# Patient Record
Sex: Male | Born: 1935 | Race: Asian | Hispanic: No | Smoking: Former smoker
Health system: Southern US, Community
[De-identification: ages and names within clinical notes are randomized; demographics above are authoritative.]

## PROBLEM LIST (undated history)

## (undated) DIAGNOSIS — M109 Gout, unspecified: Secondary | ICD-10-CM

## (undated) DIAGNOSIS — I10 Essential (primary) hypertension: Secondary | ICD-10-CM

## (undated) DIAGNOSIS — E119 Type 2 diabetes mellitus without complications: Secondary | ICD-10-CM

## (undated) HISTORY — PX: HAND SURGERY: SHX662

---

## 2005-05-17 ENCOUNTER — Ambulatory Visit: Payer: Self-pay | Admitting: Family Medicine

## 2005-07-15 ENCOUNTER — Ambulatory Visit: Payer: Self-pay | Admitting: Family Medicine

## 2005-08-19 ENCOUNTER — Ambulatory Visit: Payer: Self-pay | Admitting: Family Medicine

## 2005-09-16 ENCOUNTER — Ambulatory Visit: Payer: Self-pay | Admitting: Family Medicine

## 2012-07-24 ENCOUNTER — Encounter (HOSPITAL_COMMUNITY): Payer: Self-pay

## 2012-07-24 ENCOUNTER — Emergency Department (INDEPENDENT_AMBULATORY_CARE_PROVIDER_SITE_OTHER)
Admission: EM | Admit: 2012-07-24 | Discharge: 2012-07-24 | Disposition: A | Discharge status: Home / Self Care | Payer: Medicare Other | Source: Home / Self Care

## 2012-07-24 DIAGNOSIS — M109 Gout, unspecified: Secondary | ICD-10-CM

## 2012-07-24 MED ORDER — TRAMADOL HCL 50 MG PO TABS
50.0000 mg | ORAL_TABLET | Freq: Four times a day (QID) | ORAL | Status: DC | PRN
Start: 2012-07-24 — End: 2015-09-24

## 2012-07-24 MED ORDER — COLCHICINE 0.6 MG PO TABS: 0.6000 mg | ORAL_TABLET | Freq: Two times a day (BID) | ORAL | Status: DC | PRN

## 2012-07-24 NOTE — ED Provider Notes (Signed)
History     CSN: [id number]  Arrival date & time 07/24/12  1101   First MD Initiated Contact with Patient 07/24/12 1134      Chief Complaint  Patient presents with  . Gout    (Consider location/radiation/quality/duration/timing/severity/associated sxs/prior treatment) HPI Patient is 79 year old male who presents with main concern of sudden onset ankle swelling, associated with redness and tenderness to palpation, pain sharp and constant, 5/10 in severity. Patient reports similar episodes in the past that were related to gout flare. Patient denies any specific alleviating factors but explains that he is unable to ambulate due to pain. He's not on any medicines at home for gout. Patient denies fevers and chills, no other systemic symptoms.  History reviewed. No pertinent past medical history.  Past Surgical History  Procedure Laterality Date  . Hand surgery      No family history on file.  History  Substance Use Topics  . Smoking status: Never Smoker   . Smokeless tobacco: Not on file  . Alcohol Use: Yes      Review of Systems  Constitutional: Negative for fever, chills, diaphoresis, activity change, appetite change and fatigue.  HENT: Negative for ear pain, nosebleeds, congestion, facial swelling, rhinorrhea, neck pain, neck stiffness and ear discharge.   Eyes: Negative for pain, discharge, redness, itching and visual disturbance.  Respiratory: Negative for cough, choking, chest tightness, shortness of breath, wheezing and stridor.   Cardiovascular: Negative for chest pain, palpitations and leg swelling.  Gastrointestinal: Negative for abdominal distention.  Genitourinary: Negative for dysuria, urgency, frequency, hematuria, flank pain, decreased urine volume, difficulty urinating and dyspareunia.  Musculoskeletal: Negative for back pain Neurological: Negative for dizziness, tremors, seizures, syncope, facial asymmetry, speech difficulty, weakness, light-headedness,  numbness and headaches.  Hematological: Negative for adenopathy. Does not bruise/bleed easily.  Psychiatric/Behavioral: Negative for hallucinations, behavioral problems, confusion, dysphoric mood, decreased concentration and agitation.    Allergies  Review of patient's allergies indicates no known allergies.  Home Medications   Current Outpatient Rx  Name  Route  Sig  Dispense  Refill  . colchicine 0.6 MG tablet   Oral   Take 1 tablet (0.6 mg total) by mouth 2 (two) times daily as needed.   45 tablet   3   . traMADol (ULTRAM) 50 MG tablet   Oral   Take 1 tablet (50 mg total) by mouth every 6 (six) hours as needed for pain.   65 tablet   1     BP 173/42  Pulse 91  Temp(Src) 97.9 F (36.6 C) (Oral)  SpO2 100%  Physical Exam Constitutional: Appears well-developed and well-nourished. No distress.  HENT: Normocephalic. External right and left ear normal. Oropharynx is clear and moist.  Eyes: Conjunctivae and EOM are normal. PERRLA, no scleral icterus.  Neck: Normal ROM. Neck supple. No JVD. No tracheal deviation. No thyromegaly.  CVS: RRR, S1/S2 +, no murmurs, no gallops, no carotid bruit.  Pulmonary: Effort and breath sounds normal, no stridor, rhonchi, wheezes, rales.  Abdominal: Soft. BS +,  no distension, tenderness, rebound or guarding.  Musculoskeletal: Normal range of motion.swelling and tenderness to palpation noted around the ankle area on the left side. Findings consistent with gout flare  Lymphadenopathy: No lymphadenopathy noted, cervical, inguinal. Neuro: Alert. Normal reflexes, muscle tone coordination. No cranial nerve deficit. Skin: Skin is warm and dry. No rash noted. Not diaphoretic. No erythema. No pallor.  Psychiatric: Normal mood and affect. Behavior, judgment, thought content normal.    ED Course  Procedures (  including critical care time)  Labs Reviewed - No data to display No results found.   1. Gout   - physical exam findings and symptoms  consistent with gout flare - Will prescribe a course of cold seen along with tramadol, patient is not on allopurinol at home and will not prescribe it at this time - I advised patient to come back to see [place] in 2 weeks to evaluate healing and resolution of gout - Followup appointment will plan on checking uric acid level and initiating allopurinol if gout flare is essentially resolved    MDM  Gout        Dorothea Ogle, MD 07/24/12 1201

## 2012-07-24 NOTE — ED Notes (Signed)
Patient has history of gout Had a flair up that started yesterday in his left ankle

## 2015-09-24 ENCOUNTER — Encounter (HOSPITAL_COMMUNITY): Payer: Self-pay | Admitting: Emergency Medicine

## 2015-09-24 ENCOUNTER — Emergency Department (HOSPITAL_COMMUNITY)
Admission: EM | Admit: 2015-09-24 | Discharge: 2015-09-24 | Disposition: A | Discharge status: Home / Self Care | Payer: Medicare Other | Attending: Emergency Medicine | Admitting: Emergency Medicine

## 2015-09-24 DIAGNOSIS — I10 Essential (primary) hypertension: Secondary | ICD-10-CM | POA: Diagnosis not present

## 2015-09-24 DIAGNOSIS — E162 Hypoglycemia, unspecified: Secondary | ICD-10-CM

## 2015-09-24 DIAGNOSIS — Z791 Long term (current) use of non-steroidal anti-inflammatories (NSAID): Secondary | ICD-10-CM | POA: Insufficient documentation

## 2015-09-24 DIAGNOSIS — M1009 Idiopathic gout, multiple sites: Secondary | ICD-10-CM | POA: Diagnosis not present

## 2015-09-24 DIAGNOSIS — M109 Gout, unspecified: Secondary | ICD-10-CM

## 2015-09-24 DIAGNOSIS — E11649 Type 2 diabetes mellitus with hypoglycemia without coma: Secondary | ICD-10-CM | POA: Diagnosis not present

## 2015-09-24 HISTORY — DX: Essential (primary) hypertension: I10

## 2015-09-24 HISTORY — DX: Gout, unspecified: M10.9

## 2015-09-24 HISTORY — DX: Type 2 diabetes mellitus without complications: E11.9

## 2015-09-24 LAB — CBG MONITORING, ED
Glucose-Capillary: 166 mg/dL — ABNORMAL HIGH (ref 65–99)
Glucose-Capillary: 283 mg/dL — ABNORMAL HIGH (ref 65–99)

## 2015-09-24 LAB — CBC WITH DIFFERENTIAL/PLATELET
BASOS ABS: 0 10*3/uL (ref 0.0–0.1)
Basophils Relative: 0 %
EOS PCT: 0 %
Eosinophils Absolute: 0 10*3/uL (ref 0.0–0.7)
HEMATOCRIT: 48.9 % (ref 39.0–52.0)
HEMOGLOBIN: 15.1 g/dL (ref 13.0–17.0)
LYMPHS PCT: 15 %
Lymphs Abs: 4.3 10*3/uL — ABNORMAL HIGH (ref 0.7–4.0)
MCH: 28 pg (ref 26.0–34.0)
MCHC: 30.9 g/dL (ref 30.0–36.0)
MCV: 90.6 fL (ref 78.0–100.0)
MONOS PCT: 9 %
Monocytes Absolute: 2.6 10*3/uL — ABNORMAL HIGH (ref 0.1–1.0)
NEUTROS ABS: 22 10*3/uL — AB (ref 1.7–7.7)
Neutrophils Relative %: 76 %
Platelets: 263 10*3/uL (ref 150–400)
RBC: 5.4 MIL/uL (ref 4.22–5.81)
RDW: 15.1 % (ref 11.5–15.5)
WBC Morphology: INCREASED
WBC: 28.9 10*3/uL — ABNORMAL HIGH (ref 4.0–10.5)

## 2015-09-24 LAB — BASIC METABOLIC PANEL
ANION GAP: 31 — AB (ref 5–15)
Anion gap: 13 (ref 5–15)
BUN: 26 mg/dL — AB (ref 6–20)
BUN: 24 mg/dL — AB (ref 6–20)
CALCIUM: 7.4 mg/dL — AB (ref 8.9–10.3)
CHLORIDE: 116 mmol/L — AB (ref 101–111)
CHLORIDE: 113 mmol/L — AB (ref 101–111)
CO2: 7 mmol/L — AB (ref 22–32)
CO2: 15 mmol/L — ABNORMAL LOW (ref 22–32)
CREATININE: 1.28 mg/dL — AB (ref 0.61–1.24)
Calcium: 8.7 mg/dL — ABNORMAL LOW (ref 8.9–10.3)
Creatinine, Ser: 1.43 mg/dL — ABNORMAL HIGH (ref 0.61–1.24)
GFR calc Af Amer: 50 mL/min — ABNORMAL LOW (ref >60)
GFR, EST AFRICAN AMERICAN: 58 mL/min — AB (ref >60)
GFR, EST NON AFRICAN AMERICAN: 43 mL/min — AB (ref >60)
GFR, EST NON AFRICAN AMERICAN: 50 mL/min — AB (ref >60)
Glucose, Bld: 282 mg/dL — ABNORMAL HIGH (ref 65–99)
POTASSIUM: 4.2 mmol/L (ref 3.5–5.1)
Potassium: 5.1 mmol/L (ref 3.5–5.1)
SODIUM: 141 mmol/L (ref 135–145)
Sodium: 154 mmol/L — ABNORMAL HIGH (ref 135–145)

## 2015-09-24 LAB — LIPASE, BLOOD: Lipase: 19 U/L (ref 11–51)

## 2015-09-24 LAB — HEPATIC FUNCTION PANEL
ALBUMIN: 4.9 g/dL (ref 3.5–5.0)
ALK PHOS: 62 U/L (ref 38–126)
ALT: 63 U/L (ref 17–63)
AST: 219 U/L — AB (ref 15–41)
BILIRUBIN DIRECT: 0.2 mg/dL (ref 0.1–0.5)
BILIRUBIN TOTAL: 0.8 mg/dL (ref 0.3–1.2)
Indirect Bilirubin: 0.6 mg/dL (ref 0.3–0.9)
Total Protein: 8.5 g/dL — ABNORMAL HIGH (ref 6.5–8.1)

## 2015-09-24 LAB — ETHANOL: ALCOHOL ETHYL (B): 64 mg/dL — AB (ref <5)

## 2015-09-24 MED ORDER — INDOMETHACIN 25 MG PO CAPS: 25.0000 mg | ORAL_CAPSULE | Freq: Three times a day (TID) | ORAL | Status: DC | PRN

## 2015-09-24 MED ORDER — DEXTROSE 50 % IV SOLN
INTRAVENOUS | Status: AC
  Administered 2015-09-24: 12:00:00
  Filled 2015-09-24: qty 50

## 2015-09-24 MED ORDER — SODIUM CHLORIDE 0.9 % IV SOLN
INTRAVENOUS | Status: DC
  Administered 2015-09-24: 15:00:00 via INTRAVENOUS

## 2015-09-24 MED ORDER — SODIUM CHLORIDE 0.9 % IV BOLUS (SEPSIS)
1000.0000 mL | Freq: Once | INTRAVENOUS | Status: AC
  Administered 2015-09-24: 1000 mL via INTRAVENOUS

## 2015-09-24 NOTE — Discharge Instructions (Signed)
Avoid all forms of alcohol. Drink 2 L of water each day in addition to your usual fluid intake. Try to eat 3 meals, balanced, each day. Call your Dr. for a follow-up appointment in one week. Have your doctor recheck your blood tests to evaluate the kidney function when you see them.    Hypoglycemia Low blood sugar (hypoglycemia) means that the level of sugar in your blood is lower than it should be. Signs of low blood sugar include:  Getting sweaty.  Feeling hungry.  Feeling dizzy or weak.  Feeling sleepier than normal.  Feeling nervous.  Headaches.  Having a fast heartbeat. Low blood sugar can happen fast and can be an emergency. Your doctor can do tests to check your blood sugar level. You can have low blood sugar and not have diabetes. HOME CARE  Check your blood sugar as told by your doctor. If it is less than 70 mg/dl or as told by your doctor, take 1 of the following:  3 to 4 glucose tablets.   cup clear juice.   cup soda pop, not diet.  1 cup milk.  5 to 6 hard candies.  Recheck blood sugar after 15 minutes. Repeat until it is at the right level.  Eat a snack if it is more than 1 hour until the next meal.  Only take medicine as told by your doctor.  Do not skip meals. Eat on time.  Do not drink alcohol except with meals.  Check your blood glucose before driving.  Check your blood glucose before and after exercise.  Always carry treatment with you, such as glucose pills.  Always wear a medical alert bracelet if you have diabetes. GET HELP RIGHT AWAY IF:   Your blood glucose goes below 70 mg/dl or as told by your doctor, and you:  Are confused.  Are not able to swallow.  Pass out (faint).  You cannot treat yourself. You may need someone to help you.  You have low blood sugar problems often.  You have problems from your medicines.  You are not feeling better after 3 to 4 days.  You have vision changes. MAKE SURE YOU:   Understand  these instructions.  Will watch this condition.  Will get help right away if you are not doing well or get worse.   This information is not intended to replace advice given to you by your health care provider. Make sure you discuss any questions you have with your health care provider.   Document Released: 07/24/2009 Document Revised: 05/20/2014 Document Reviewed: 01/03/2015 Elsevier Interactive Patient Education 2016 Elsevier Inc.  Gout Gout is when your joints become red, sore, and swell (inflamed). This is caused by the buildup of uric acid crystals in the joints. Uric acid is a chemical that is normally in the blood. If the level of uric acid gets too high in the blood, these crystals form in your joints and tissues. Over time, these crystals can form into masses near the joints and tissues. These masses can destroy bone and cause the bone to look misshapen (deformed). HOME CARE   Do not take aspirin for pain.  Only take medicine as told by your doctor.  Rest the joint as much as you can. When in bed, keep sheets and blankets off painful areas.  Keep the sore joints raised (elevated).  Put warm or cold packs on painful joints. Use of warm or cold packs depends on which works best for you.  Use crutches if  the painful joint is in your leg.  Drink enough fluids to keep your pee (urine) clear or pale yellow. Limit alcohol, sugary drinks, and drinks with fructose in them.  Follow your diet instructions. Pay careful attention to how much protein you eat. Include fruits, vegetables, whole grains, and fat-free or low-fat milk products in your daily diet. Talk to your doctor or dietitian about the use of coffee, vitamin C, and cherries. These may help lower uric acid levels.  Keep a healthy body weight. GET HELP RIGHT AWAY IF:   You have watery poop (diarrhea), throw up (vomit), or have any side effects from medicines.  You do not feel better in 24 hours, or you are getting  worse.  Your joint becomes suddenly more tender, and you have chills or a fever. MAKE SURE YOU:   Understand these instructions.  Will watch your condition.  Will get help right away if you are not doing well or get worse.   This information is not intended to replace advice given to you by your health care provider. Make sure you discuss any questions you have with your health care provider.   Document Released: 02/06/2008 Document Revised: 05/20/2014 Document Reviewed: 12/11/2011 Elsevier Interactive Patient Education [employer] Elsevier Inc.

## 2015-09-24 NOTE — ED Notes (Signed)
Pt from home via EMS after family found unresponsive. Pt had CBG 19-was given 1 mg glucagon IM which increased CBG to 44. Pt received another 1 mg glucagon IM after CBG was 32. Pt does not speak english. Family member at bedside.  Pt given 1 amp D50 upon arrival and is now awake and talking. Pt in NAD

## 2015-09-24 NOTE — ED Provider Notes (Signed)
CSN: [id number]     Arrival date & time 09/24/15  1128 History   First MD Initiated Contact with Patient 09/24/15 1204     Chief Complaint  Patient presents with  . Hypoglycemia     (Consider location/radiation/quality/duration/timing/severity/associated sxs/prior Treatment) HPI   Brian Potter is a 82 y.o. male who presents for evaluation of unresponsiveness. EMS transported him. They checked his CBG, that was low at 19. They tented IV access but were unable. They treated him with IM glucagon 2 mg. His blood sugar improved to 32 and he arrived to the ED, unresponsive, restrained.  Level V caveat- altered mental status    Past Medical History  Diagnosis Date  . Diabetes mellitus without complication (HCC)   . Gout   . Hypertension    Past Surgical History  Procedure Laterality Date  . Hand surgery     No family history on file. Social History  Substance Use Topics  . Smoking status: Never Smoker   . Smokeless tobacco: None  . Alcohol Use: Yes    Review of Systems  Unable to perform ROS: Mental status change      Allergies  Review of patient's allergies indicates no known allergies.  Home Medications   Prior to Admission medications   Medication Sig Start Date End Date Taking? Authorizing Provider  indomethacin (INDOCIN) 50 MG capsule Take 50 mg by mouth 3 (three) times daily as needed (inflammation or gout.).   Yes Historical Provider, MD   BP 167/65 mmHg  Pulse 98  Temp(Src) 98.1 F (36.7 C) (Oral)  Resp 20  SpO2 100% Physical Exam  Constitutional: He appears well-developed and well-nourished. He appears distressed.  HENT:  Head: Normocephalic and atraumatic.  Right Ear: External ear normal.  Left Ear: External ear normal.  Eyes: Conjunctivae and EOM are normal. Pupils are equal, round, and reactive to light.  Neck: Normal range of motion and phonation normal. Neck supple.  Cardiovascular: Normal rate, regular rhythm and normal heart sounds.    Pulmonary/Chest: Effort normal and breath sounds normal. No respiratory distress. He exhibits no bony tenderness.  Abdominal: Soft. There is no tenderness.  Musculoskeletal: Normal range of motion.  Neurological: No cranial nerve deficit or sensory deficit. He exhibits normal muscle tone. Coordination normal.  Uncooperative, moving all extremities, in a nonpurposeful manner. Does not respond to verbal stimulation.  Skin: Skin is warm, dry and intact.  Psychiatric:  Agitated  Nursing note and vitals reviewed.   ED Course  Procedures (including critical care time)  Immediate care on arrival-  I placed a left external jugular line, using an 18-gauge angiocath. He was given D50 with immediate recovery of normal mental status. Evaluation ordered.  Medications  0.9 %  sodium chloride infusion ( Intravenous New Bag/Given 09/24/15 1510)  dextrose 50 % solution (  Given 09/24/15 1130)  sodium chloride 0.9 % bolus 1,000 mL (0 mLs Intravenous Stopped 09/24/15 1458)    Patient Vitals for the past 24 hrs:  BP Temp Temp src Pulse Resp SpO2  09/24/15 1627 167/65 mmHg - - 98 20 100 %  09/24/15 1512 166/76 mmHg 98.1 F (36.7 C) Oral 97 22 100 %  09/24/15 1140 139/65 mmHg 97.4 F (36.3 C) Oral 115 24 94 %   14:10- he remains alert, calm, cooperative. He is eating and drinking. His daughter is here and states that the patient was drinking yesterday because he was distraught over a family member who attempted suicide. He does not have serious chronic  illnesses including diabetes, cardiac disease or hypertension. He does have gout, and takes indomethacin, but has been out of it for 1 week. He gets his care at a local primary care clinic. He lives with family members who are assisting in his health care.  5:36 PM Reevaluation with update and discussion. After initial assessment and treatment, an updated evaluation reveals he continues to be Aleve and drink, and is comfortable. Family members report that he  has completely normal mental status at this time. Vital signs are reviewed, and are reassuring. The patient and his family members are comfortable with him going home. All questions were answered. Brian Potter L    Labs Review Labs Reviewed  HEPATIC FUNCTION PANEL - Abnormal; Notable for the following:    Total Protein 8.5 (*)    AST 219 (*)    All other components within normal limits  ETHANOL - Abnormal; Notable for the following:    Alcohol, Ethyl (B) 64 (*)    All other components within normal limits  BASIC METABOLIC PANEL - Abnormal; Notable for the following:    Sodium 154 (*)    Chloride 116 (*)    CO2 7 (*)    Glucose, Bld <20 (*)    BUN 26 (*)    Creatinine, Ser 1.43 (*)    Calcium 8.7 (*)    GFR calc non Af Amer 43 (*)    GFR calc Af Amer 50 (*)    Anion gap 31 (*)    All other components within normal limits  CBC WITH DIFFERENTIAL/PLATELET - Abnormal; Notable for the following:    WBC 28.9 (*)    Neutro Abs 22.0 (*)    Lymphs Abs 4.3 (*)    Monocytes Absolute 2.6 (*)    All other components within normal limits  BASIC METABOLIC PANEL - Abnormal; Notable for the following:    Chloride 113 (*)    CO2 15 (*)    Glucose, Bld 282 (*)    BUN 24 (*)    Creatinine, Ser 1.28 (*)    Calcium 7.4 (*)    GFR calc non Af Amer 50 (*)    GFR calc Af Amer 58 (*)    All other components within normal limits  CBG MONITORING, ED - Abnormal; Notable for the following:    Glucose-Capillary 166 (*)    All other components within normal limits  CBG MONITORING, ED - Abnormal; Notable for the following:    Glucose-Capillary 283 (*)    All other components within normal limits  LIPASE, BLOOD    Imaging Review No results found. I have personally reviewed and evaluated these images and lab results as part of my medical decision-making.   EKG Interpretation None      MDM   Final diagnoses:  Hypoglycemia  Gout of multiple sites, unspecified cause, unspecified chronicity     Hypoglycemia, likely nutritional and associated with heavy use of alcohol. His events with an elevated alcohol level, greater than 12 hours after his last drink. Progressively improved with a single injection of D50, and maintained elevated, after eating, drinking. Incidental electrolyte abnormalities are likely related to profound hypoglycemia. Is improved with eating, IV fluids, and drinking oral fluids. Doubt metabolic instability or impending vascular collapse. There is no indication at this time for admission. I suspect mild dehydration, which is likely nutritional. His degree of improvement justifies discharge with close follow-up as an outpatient.  Nursing Notes Reviewed/ Care Coordinated Applicable Imaging Reviewed Interpretation of Laboratory  Data incorporated into ED treatment  The patient appears reasonably screened and/or stabilized for discharge and I doubt any other medical condition or other [facility] requiring further screening, evaluation, or treatment in the ED at this time prior to discharge.  Plan: Home Medications- Indocin; Home Treatments- avoid alcohol, drink extra fluid, follow-up with PCP; return here if the recommended treatment, does not improve the symptoms; Recommended follow up- PCP 1 week for checkup and repeat blood testing   Mancel Bale, MD 09/24/15 [phone]

## 2015-09-24 NOTE — ED Notes (Signed)
Bed: RESB Expected date:  Expected time:  Means of arrival:  Comments: Hypoglycemia CBG 19 not IV access

## 2015-09-24 NOTE — ED Notes (Signed)
Tolerating PO fluids/food.

## 2016-06-04 DIAGNOSIS — M79671 Pain in right foot: Secondary | ICD-10-CM | POA: Diagnosis not present

## 2017-09-20 DIAGNOSIS — Z23 Encounter for immunization: Secondary | ICD-10-CM | POA: Diagnosis not present

## 2018-01-25 DIAGNOSIS — Z23 Encounter for immunization: Secondary | ICD-10-CM | POA: Diagnosis not present

## 2018-03-20 DIAGNOSIS — M79671 Pain in right foot: Secondary | ICD-10-CM | POA: Diagnosis not present

## 2018-03-20 DIAGNOSIS — M25572 Pain in left ankle and joints of left foot: Secondary | ICD-10-CM | POA: Diagnosis not present

## 2018-03-20 DIAGNOSIS — M25562 Pain in left knee: Secondary | ICD-10-CM | POA: Diagnosis not present

## 2019-04-22 ENCOUNTER — Emergency Department (HOSPITAL_COMMUNITY)
Admission: EM | Admit: 2019-04-22 | Discharge: 2019-04-22 | Disposition: A | Discharge status: Home / Self Care | Payer: Medicare Other | Attending: Emergency Medicine | Admitting: Emergency Medicine

## 2019-04-22 ENCOUNTER — Emergency Department (HOSPITAL_COMMUNITY): Payer: Medicare Other

## 2019-04-22 ENCOUNTER — Other Ambulatory Visit: Payer: Self-pay

## 2019-04-22 DIAGNOSIS — R2231 Localized swelling, mass and lump, right upper limb: Secondary | ICD-10-CM | POA: Insufficient documentation

## 2019-04-22 DIAGNOSIS — I10 Essential (primary) hypertension: Secondary | ICD-10-CM | POA: Insufficient documentation

## 2019-04-22 DIAGNOSIS — E119 Type 2 diabetes mellitus without complications: Secondary | ICD-10-CM | POA: Insufficient documentation

## 2019-04-22 DIAGNOSIS — M1A9XX0 Chronic gout, unspecified, without tophus (tophi): Secondary | ICD-10-CM

## 2019-04-22 DIAGNOSIS — M79641 Pain in right hand: Secondary | ICD-10-CM | POA: Diagnosis present

## 2019-04-22 LAB — CBC WITH DIFFERENTIAL/PLATELET
Abs Immature Granulocytes: 0.04 10*3/uL (ref 0.00–0.07)
Basophils Absolute: 0 10*3/uL (ref 0.0–0.1)
Basophils Relative: 0 %
Eosinophils Absolute: 0.5 10*3/uL (ref 0.0–0.5)
Eosinophils Relative: 6 %
HCT: 38.7 % — ABNORMAL LOW (ref 39.0–52.0)
Hemoglobin: 11.8 g/dL — ABNORMAL LOW (ref 13.0–17.0)
Immature Granulocytes: 0 %
Lymphocytes Relative: 35 %
Lymphs Abs: 3.3 10*3/uL (ref 0.7–4.0)
MCH: 25.5 pg — ABNORMAL LOW (ref 26.0–34.0)
MCHC: 30.5 g/dL (ref 30.0–36.0)
MCV: 83.6 fL (ref 80.0–100.0)
Monocytes Absolute: 0.9 10*3/uL (ref 0.1–1.0)
Monocytes Relative: 10 %
Neutro Abs: 4.7 10*3/uL (ref 1.7–7.7)
Neutrophils Relative %: 49 %
Platelets: 344 10*3/uL (ref 150–400)
RBC: 4.63 MIL/uL (ref 4.22–5.81)
RDW: 17.1 % — ABNORMAL HIGH (ref 11.5–15.5)
WBC: 9.5 10*3/uL (ref 4.0–10.5)
nRBC: 0 % (ref 0.0–0.2)

## 2019-04-22 LAB — COMPREHENSIVE METABOLIC PANEL
ALT: 7 U/L (ref 0–44)
AST: 16 U/L (ref 15–41)
Albumin: 3 g/dL — ABNORMAL LOW (ref 3.5–5.0)
Alkaline Phosphatase: 38 U/L (ref 38–126)
Anion gap: 10 (ref 5–15)
BUN: 14 mg/dL (ref 8–23)
CO2: 25 mmol/L (ref 22–32)
Calcium: 8.7 mg/dL — ABNORMAL LOW (ref 8.9–10.3)
Chloride: 106 mmol/L (ref 98–111)
Creatinine, Ser: 1.2 mg/dL (ref 0.61–1.24)
GFR calc Af Amer: >60 mL/min (ref >60)
GFR calc non Af Amer: 54 mL/min — ABNORMAL LOW (ref >60)
Glucose, Bld: 102 mg/dL — ABNORMAL HIGH (ref 70–99)
Potassium: 4.3 mmol/L (ref 3.5–5.1)
Sodium: 141 mmol/L (ref 135–145)
Total Bilirubin: 0.5 mg/dL (ref 0.3–1.2)
Total Protein: 6.9 g/dL (ref 6.5–8.1)

## 2019-04-22 MED ORDER — INDOMETHACIN 50 MG PO CAPS: 50.0000 mg | ORAL_CAPSULE | Freq: Three times a day (TID) | ORAL | 0 refills | Status: DC | PRN

## 2019-04-22 MED ORDER — INDOMETHACIN 25 MG PO CAPS
50.0000 mg | ORAL_CAPSULE | Freq: Once | ORAL | Status: AC
  Administered 2019-04-22: 11:00:00 50 mg via ORAL
  Filled 2019-04-22: qty 2

## 2019-04-22 NOTE — ED Provider Notes (Signed)
[facility] EMERGENCY DEPARTMENT Provider Note   CSN: [id number] Arrival date & time: 04/22/19  0845     History Chief Complaint  Patient presents with  . Wound Infection    Brian Potter is a 85 y.o. male.  Presents to the emergency room with chief complaint of hand pain and swelling.  Patient states he has had a very long history of gout, has had long history of having swelling in his right hand associated pain.  States he recently ran out of his indomethacin and has only been taking Tylenol.  States Tylenol is not as effective and wants a refill for his indomethacin.  Denies any new skin color changes, no significant change in swelling, noted that swelling today is actually better than it has been over the past couple weeks.  No fevers.  Has been evaluated by [facility] orthopedic previously, PA Dannielle Burn prescribed last course of indomethacin.  Not on any other medicines for gout.  States he had a prior history of having gout flare in his big toe, not currently having any pain or swelling there.  HPI     Past Medical History:  Diagnosis Date  . Diabetes mellitus without complication (HCC)   . Gout   . Hypertension     There are no problems to display for this patient.   Past Surgical History:  Procedure Laterality Date  . HAND SURGERY         No family history on file.  Social History   Tobacco Use  . Smoking status: Never Smoker  Substance Use Topics  . Alcohol use: Yes  . Drug use: Not on file    Home Medications Prior to Admission medications   Medication Sig Start Date End Date Taking? Authorizing Provider  indomethacin (INDOCIN) 25 MG capsule Take 1 capsule (25 mg total) by mouth 3 (three) times daily as needed for moderate pain. 09/24/15   Mancel Bale, MD    Allergies    Patient has no known allergies.  Review of Systems   Review of Systems  Constitutional: Negative for chills and fever.  HENT: Negative for ear pain and sore  throat.   Eyes: Negative for pain and visual disturbance.  Respiratory: Negative for cough and shortness of breath.   Cardiovascular: Negative for chest pain and palpitations.  Gastrointestinal: Negative for abdominal pain and vomiting.  Genitourinary: Negative for dysuria and hematuria.  Musculoskeletal: Positive for arthralgias. Negative for back pain.  Skin: Negative for color change and rash.  Neurological: Negative for seizures and syncope.  All other systems reviewed and are negative.   Physical Exam Updated Vital Signs BP (!) 163/74 (BP Location: Left Arm)   Pulse 80   Temp 98.1 F (36.7 C) (Oral)   Resp 16   SpO2 100%   Physical Exam Vitals and nursing note reviewed.  Constitutional:      Appearance: He is well-developed.  HENT:     Head: Normocephalic and atraumatic.  Eyes:     Conjunctiva/sclera: Conjunctivae normal.  Cardiovascular:     Rate and Rhythm: Normal rate and regular rhythm.     Heart sounds: No murmur.  Pulmonary:     Effort: Pulmonary effort is normal. No respiratory distress.     Breath sounds: Normal breath sounds.  Abdominal:     Palpations: Abdomen is soft.     Tenderness: There is no abdominal tenderness.  Musculoskeletal:     Cervical back: Neck supple.     Comments: RUE:  dorsum of right hand has generalized swelling, centered over 2nd MCP, no overlying erythema, no warmth to touch, normal wrist and hand ROM, distal sensation and cap refill intact  Skin:    General: Skin is warm and dry.  Neurological:     Mental Status: He is alert.     ED Results / Procedures / Treatments   Labs (all labs ordered are listed, but only abnormal results are displayed) Labs Reviewed  COMPREHENSIVE METABOLIC PANEL - Abnormal; Notable for the following components:      Result Value   Glucose, Bld 102 (*)    Calcium 8.7 (*)    Albumin 3.0 (*)    GFR calc non Af Amer 54 (*)    All other components within normal limits  CBC WITH DIFFERENTIAL/PLATELET -  Abnormal; Notable for the following components:   Hemoglobin 11.8 (*)    HCT 38.7 (*)    MCH 25.5 (*)    RDW 17.1 (*)    All other components within normal limits    EKG None  Radiology No results found.  Procedures Procedures (including critical care time)  Medications Ordered in ED Medications  indomethacin (INDOCIN) capsule 50 mg (has no administration in time range)    ED Course  I have reviewed the triage vital signs and the nursing notes.  Pertinent labs & imaging results that were available during my care of the patient were reviewed by me and considered in my medical decision making (see chart for details).    MDM Rules/Calculators/A&P     CHA2DS2/VAS Stroke Risk Points      N/A >= 2 Points: High Risk  1 - 1.99 Points: Medium Risk  0 Points: Low Risk    A final score could not be computed because of missing components.: Last  Change: N/A     This score determines the patient's risk of having a stroke if the  patient has atrial fibrillation.      This score is not applicable to this patient. Components are not  calculated.                   85 year old male presents to ER with right hand swelling, pain.  Patient has long history of gout, stated his primary purpose in coming to ER today with medication refill.  He denied any acute changes in the pain and swelling in his hand.  Has had symptoms for many months.  Suspect patient suffers from chronic gout.  Low suspicion for septic joint process at this time.  Labs within normal limits, x-rays unremarkable.  He has seen orthopedic previously.  I recommended patient follow-up in our clinic.  Provide a refill of his indomethacin which he states has significantly helped previously.  Patient is [place] only speaking, utilized Optometrist for entirety of visit.  Final Clinical Impression(s) / ED Diagnoses Final diagnoses:  Chronic gout without tophus, unspecified cause, unspecified site    Rx / DC Orders ED  Discharge Orders    None       Lucrezia Starch, MD 04/22/19 1534

## 2019-04-22 NOTE — ED Notes (Signed)
Patient transported to X-ray 

## 2019-04-22 NOTE — ED Notes (Signed)
Patient verbalizes understanding of discharge instructions. Opportunity for questioning and answers were provided. Armband removed by staff, pt discharged from ED.  

## 2019-04-22 NOTE — ED Triage Notes (Signed)
Pt from home with daughter complaining of swelling to right hand, hx of gout. Pt speaks vietnamese and interpretation provided by pt's daughter. Pt has been out of his indamethacin x 1 month. Axox4.

## 2019-04-22 NOTE — Discharge Instructions (Signed)
Please call be orthopedic office as discussed to schedule a follow-up appointment, ideally to be seen within the next week regarding your hand pain and gout.  Please take medicine as prescribed as needed for pain control.  Return to ER if you develop worsening swelling, fever or other new concerning symptom.

## 2019-05-27 DIAGNOSIS — M79671 Pain in right foot: Secondary | ICD-10-CM | POA: Diagnosis not present

## 2019-05-27 DIAGNOSIS — M79641 Pain in right hand: Secondary | ICD-10-CM | POA: Diagnosis not present

## 2020-06-27 DIAGNOSIS — M25521 Pain in right elbow: Secondary | ICD-10-CM | POA: Diagnosis not present

## 2020-07-04 ENCOUNTER — Emergency Department (HOSPITAL_COMMUNITY): Payer: Medicare Other

## 2020-07-04 ENCOUNTER — Inpatient Hospital Stay (HOSPITAL_COMMUNITY): Payer: Medicare Other

## 2020-07-04 ENCOUNTER — Encounter (HOSPITAL_COMMUNITY): Payer: Self-pay | Admitting: Emergency Medicine

## 2020-07-04 ENCOUNTER — Inpatient Hospital Stay (HOSPITAL_COMMUNITY)
Admission: EM | Admit: 2020-07-04 | Discharge: 2020-07-07 | DRG: 554 | Disposition: A | Discharge status: Home Health | Payer: Medicare Other | Attending: Internal Medicine | Admitting: Internal Medicine

## 2020-07-04 DIAGNOSIS — B999 Unspecified infectious disease: Secondary | ICD-10-CM

## 2020-07-04 DIAGNOSIS — M7989 Other specified soft tissue disorders: Secondary | ICD-10-CM | POA: Diagnosis not present

## 2020-07-04 DIAGNOSIS — M869 Osteomyelitis, unspecified: Secondary | ICD-10-CM

## 2020-07-04 DIAGNOSIS — R609 Edema, unspecified: Secondary | ICD-10-CM | POA: Diagnosis not present

## 2020-07-04 DIAGNOSIS — Z79899 Other long term (current) drug therapy: Secondary | ICD-10-CM | POA: Diagnosis not present

## 2020-07-04 DIAGNOSIS — L03119 Cellulitis of unspecified part of limb: Secondary | ICD-10-CM | POA: Diagnosis not present

## 2020-07-04 DIAGNOSIS — M86172 Other acute osteomyelitis, left ankle and foot: Secondary | ICD-10-CM

## 2020-07-04 DIAGNOSIS — D649 Anemia, unspecified: Secondary | ICD-10-CM | POA: Diagnosis not present

## 2020-07-04 DIAGNOSIS — N179 Acute kidney failure, unspecified: Secondary | ICD-10-CM | POA: Diagnosis present

## 2020-07-04 DIAGNOSIS — D62 Acute posthemorrhagic anemia: Secondary | ICD-10-CM | POA: Diagnosis present

## 2020-07-04 DIAGNOSIS — Z87891 Personal history of nicotine dependence: Secondary | ICD-10-CM

## 2020-07-04 DIAGNOSIS — Z66 Do not resuscitate: Secondary | ICD-10-CM | POA: Diagnosis present

## 2020-07-04 DIAGNOSIS — R634 Abnormal weight loss: Secondary | ICD-10-CM | POA: Diagnosis not present

## 2020-07-04 DIAGNOSIS — Z20822 Contact with and (suspected) exposure to covid-19: Secondary | ICD-10-CM | POA: Diagnosis present

## 2020-07-04 DIAGNOSIS — Z791 Long term (current) use of non-steroidal anti-inflammatories (NSAID): Secondary | ICD-10-CM | POA: Diagnosis not present

## 2020-07-04 DIAGNOSIS — M1A9XX1 Chronic gout, unspecified, with tophus (tophi): Secondary | ICD-10-CM

## 2020-07-04 DIAGNOSIS — E119 Type 2 diabetes mellitus without complications: Secondary | ICD-10-CM | POA: Diagnosis present

## 2020-07-04 DIAGNOSIS — I959 Hypotension, unspecified: Secondary | ICD-10-CM | POA: Diagnosis not present

## 2020-07-04 DIAGNOSIS — M109 Gout, unspecified: Secondary | ICD-10-CM | POA: Diagnosis not present

## 2020-07-04 DIAGNOSIS — T8182XA Emphysema (subcutaneous) resulting from a procedure, initial encounter: Secondary | ICD-10-CM | POA: Diagnosis not present

## 2020-07-04 DIAGNOSIS — M79672 Pain in left foot: Secondary | ICD-10-CM | POA: Diagnosis not present

## 2020-07-04 DIAGNOSIS — L089 Local infection of the skin and subcutaneous tissue, unspecified: Secondary | ICD-10-CM | POA: Diagnosis not present

## 2020-07-04 DIAGNOSIS — S51001A Unspecified open wound of right elbow, initial encounter: Secondary | ICD-10-CM | POA: Diagnosis not present

## 2020-07-04 DIAGNOSIS — L03116 Cellulitis of left lower limb: Secondary | ICD-10-CM | POA: Diagnosis present

## 2020-07-04 DIAGNOSIS — K922 Gastrointestinal hemorrhage, unspecified: Secondary | ICD-10-CM | POA: Diagnosis present

## 2020-07-04 DIAGNOSIS — E1122 Type 2 diabetes mellitus with diabetic chronic kidney disease: Secondary | ICD-10-CM | POA: Diagnosis present

## 2020-07-04 DIAGNOSIS — N1831 Chronic kidney disease, stage 3a: Secondary | ICD-10-CM | POA: Diagnosis present

## 2020-07-04 DIAGNOSIS — G8929 Other chronic pain: Secondary | ICD-10-CM | POA: Diagnosis present

## 2020-07-04 DIAGNOSIS — M19072 Primary osteoarthritis, left ankle and foot: Secondary | ICD-10-CM | POA: Diagnosis not present

## 2020-07-04 DIAGNOSIS — I129 Hypertensive chronic kidney disease with stage 1 through stage 4 chronic kidney disease, or unspecified chronic kidney disease: Secondary | ICD-10-CM | POA: Diagnosis present

## 2020-07-04 DIAGNOSIS — L03115 Cellulitis of right lower limb: Secondary | ICD-10-CM | POA: Diagnosis not present

## 2020-07-04 DIAGNOSIS — T797XXA Traumatic subcutaneous emphysema, initial encounter: Secondary | ICD-10-CM | POA: Diagnosis not present

## 2020-07-04 DIAGNOSIS — M86179 Other acute osteomyelitis, unspecified ankle and foot: Secondary | ICD-10-CM | POA: Diagnosis present

## 2020-07-04 DIAGNOSIS — R195 Other fecal abnormalities: Secondary | ICD-10-CM | POA: Diagnosis not present

## 2020-07-04 LAB — CBC WITH DIFFERENTIAL/PLATELET
Abs Immature Granulocytes: 0.11 10*3/uL — ABNORMAL HIGH (ref 0.00–0.07)
Basophils Absolute: 0.1 10*3/uL (ref 0.0–0.1)
Basophils Relative: 0 %
Eosinophils Absolute: 0.1 10*3/uL (ref 0.0–0.5)
Eosinophils Relative: 1 %
HCT: 23.9 % — ABNORMAL LOW (ref 39.0–52.0)
Hemoglobin: 7.6 g/dL — ABNORMAL LOW (ref 13.0–17.0)
Immature Granulocytes: 1 %
Lymphocytes Relative: 18 %
Lymphs Abs: 2.4 10*3/uL (ref 0.7–4.0)
MCH: 27.3 pg (ref 26.0–34.0)
MCHC: 31.8 g/dL (ref 30.0–36.0)
MCV: 86 fL (ref 80.0–100.0)
Monocytes Absolute: 0.9 10*3/uL (ref 0.1–1.0)
Monocytes Relative: 7 %
Neutro Abs: 9.9 10*3/uL — ABNORMAL HIGH (ref 1.7–7.7)
Neutrophils Relative %: 73 %
Platelets: 677 10*3/uL — ABNORMAL HIGH (ref 150–400)
RBC: 2.78 MIL/uL — ABNORMAL LOW (ref 4.22–5.81)
RDW: 14.3 % (ref 11.5–15.5)
WBC: 13.5 10*3/uL — ABNORMAL HIGH (ref 4.0–10.5)
nRBC: 0 % (ref 0.0–0.2)

## 2020-07-04 LAB — COMPREHENSIVE METABOLIC PANEL
ALT: 16 U/L (ref 0–44)
AST: 58 U/L — ABNORMAL HIGH (ref 15–41)
Albumin: 2.2 g/dL — ABNORMAL LOW (ref 3.5–5.0)
Alkaline Phosphatase: 118 U/L (ref 38–126)
Anion gap: 13 (ref 5–15)
BUN: 39 mg/dL — ABNORMAL HIGH (ref 8–23)
CO2: 21 mmol/L — ABNORMAL LOW (ref 22–32)
Calcium: 8.6 mg/dL — ABNORMAL LOW (ref 8.9–10.3)
Chloride: 99 mmol/L (ref 98–111)
Creatinine, Ser: 1.28 mg/dL — ABNORMAL HIGH (ref 0.61–1.24)
GFR, Estimated: 53 mL/min — ABNORMAL LOW (ref >60)
Glucose, Bld: 112 mg/dL — ABNORMAL HIGH (ref 70–99)
Potassium: 4.7 mmol/L (ref 3.5–5.1)
Sodium: 133 mmol/L — ABNORMAL LOW (ref 135–145)
Total Bilirubin: 0.6 mg/dL (ref 0.3–1.2)
Total Protein: 7.8 g/dL (ref 6.5–8.1)

## 2020-07-04 LAB — IRON AND TIBC
Iron: 11 ug/dL — ABNORMAL LOW (ref 45–182)
Saturation Ratios: 6 % — ABNORMAL LOW (ref 17.9–39.5)
TIBC: 183 ug/dL — ABNORMAL LOW (ref 250–450)
UIBC: 172 ug/dL

## 2020-07-04 LAB — FOLATE: Folate: 11.8 ng/mL (ref >5.9)

## 2020-07-04 LAB — FERRITIN: Ferritin: 146 ng/mL (ref 24–336)

## 2020-07-04 LAB — RETICULOCYTES
Immature Retic Fract: 26.9 % — ABNORMAL HIGH (ref 2.3–15.9)
RBC.: 2.74 MIL/uL — ABNORMAL LOW (ref 4.22–5.81)
Retic Count, Absolute: 59.2 10*3/uL (ref 19.0–186.0)
Retic Ct Pct: 2.2 % (ref 0.4–3.1)

## 2020-07-04 LAB — VITAMIN B12: Vitamin B-12: 596 pg/mL (ref 180–914)

## 2020-07-04 LAB — LACTIC ACID, PLASMA
Lactic Acid, Venous: 1.5 mmol/L (ref 0.5–1.9)
Lactic Acid, Venous: 1.2 mmol/L (ref 0.5–1.9)

## 2020-07-04 LAB — POC OCCULT BLOOD, ED: Fecal Occult Bld: POSITIVE — AB

## 2020-07-04 LAB — ABO/RH: ABO/RH(D): A POS

## 2020-07-04 LAB — TSH: TSH: 2.798 u[IU]/mL (ref 0.350–4.500)

## 2020-07-04 LAB — MAGNESIUM: Magnesium: 2.1 mg/dL (ref 1.7–2.4)

## 2020-07-04 MED ORDER — ACETAMINOPHEN 325 MG PO TABS
650.0000 mg | ORAL_TABLET | Freq: Four times a day (QID) | ORAL | Status: DC | PRN
  Administered 2020-07-06: 650 mg via ORAL
  Filled 2020-07-04: qty 2

## 2020-07-04 MED ORDER — GADOBUTROL 1 MMOL/ML IV SOLN: 5.0000 mL | Freq: Once | INTRAVENOUS | Status: DC | PRN

## 2020-07-04 MED ORDER — MORPHINE SULFATE (PF) 4 MG/ML IV SOLN
4.0000 mg | Freq: Once | INTRAVENOUS | Status: AC
  Administered 2020-07-04: 4 mg via INTRAVENOUS
  Filled 2020-07-04: qty 1

## 2020-07-04 MED ORDER — SODIUM CHLORIDE 0.9 % IV SOLN: INTRAVENOUS | Status: DC

## 2020-07-04 MED ORDER — ONDANSETRON HCL 4 MG/2ML IJ SOLN: 4.0000 mg | Freq: Four times a day (QID) | INTRAMUSCULAR | Status: DC | PRN

## 2020-07-04 MED ORDER — ENOXAPARIN SODIUM 40 MG/0.4ML ~~LOC~~ SOLN: 40.0000 mg | SUBCUTANEOUS | Status: DC

## 2020-07-04 MED ORDER — ONDANSETRON HCL 4 MG/2ML IJ SOLN
4.0000 mg | Freq: Once | INTRAMUSCULAR | Status: AC
  Administered 2020-07-04: 4 mg via INTRAVENOUS
  Filled 2020-07-04: qty 2

## 2020-07-04 MED ORDER — CEFAZOLIN SODIUM-DEXTROSE 1-4 GM/50ML-% IV SOLN
1.0000 g | Freq: Once | INTRAVENOUS | Status: AC
  Administered 2020-07-04: 1 g via INTRAVENOUS
  Filled 2020-07-04: qty 50

## 2020-07-04 MED ORDER — ONDANSETRON HCL 4 MG PO TABS: 4.0000 mg | ORAL_TABLET | Freq: Four times a day (QID) | ORAL | Status: DC | PRN

## 2020-07-04 MED ORDER — OXYCODONE HCL 5 MG PO TABS
5.0000 mg | ORAL_TABLET | ORAL | Status: DC | PRN
  Administered 2020-07-06 – 2020-07-07 (×3): 5 mg via ORAL
  Filled 2020-07-04 (×3): qty 1

## 2020-07-04 MED ORDER — PANTOPRAZOLE SODIUM 40 MG IV SOLR
40.0000 mg | Freq: Two times a day (BID) | INTRAVENOUS | Status: DC
  Administered 2020-07-05: 40 mg via INTRAVENOUS
  Filled 2020-07-04: qty 40

## 2020-07-04 MED ORDER — ACETAMINOPHEN 650 MG RE SUPP: 650.0000 mg | Freq: Four times a day (QID) | RECTAL | Status: DC | PRN

## 2020-07-04 NOTE — ED Notes (Signed)
Spoke with [employer]. She spoke with granddaughter in room and she requested that grandson be allowed back to room to speak with patient. Grandson escorted back to patient's room.

## 2020-07-04 NOTE — ED Notes (Signed)
Unsuccessful attempt x 2 at IV. [place] IV will be attempted.

## 2020-07-04 NOTE — Progress Notes (Signed)
Pt unable to travel to MRI. MD in room.

## 2020-07-04 NOTE — ED Notes (Signed)
Patient transported to MRI 

## 2020-07-04 NOTE — ED Notes (Signed)
Patient has an open draining wound on right elbow

## 2020-07-04 NOTE — ED Notes (Signed)
ED TO INPATIENT HANDOFF REPORT  Name/Age/Gender Brian Potter 87 y.o. male  Code Status    Code Status Orders  (From admission, onward)         Start     Ordered   07/04/20 1754  Do not attempt resuscitation (DNR)  Continuous       Question Answer Comment  In the event of cardiac or respiratory ARREST Do not call a "code blue"   In the event of cardiac or respiratory ARREST Do not perform Intubation, CPR, defibrillation or ACLS   In the event of cardiac or respiratory ARREST Use medication by any route, position, wound care, and other measures to relive pain and suffering. May use oxygen, suction and manual treatment of airway obstruction as needed for comfort.      07/04/20 1754        Code Status History    This patient has a current code status but no historical code status.   Advance Care Planning Activity      Home/SNF/Other Home  Chief Complaint Osteomyelitis of ankle or foot, acute (HCC) [M86.179]  Level of Care/Admitting Diagnosis ED Disposition    ED Disposition Condition Comment   Admit  Hospital Area: [facility] [[id number]]  Level of Care: Med-Surg [16]  May admit patient to [facility] or [facility] if equivalent level of care is available:: No  Covid Evaluation: Asymptomatic Screening Protocol (No Symptoms)  Diagnosis: Osteomyelitis of ankle or foot, acute [facility]) [[id number]]  Admitting Physician: Hughie Closs [[id number]]  Attending Physician: Hughie Closs [phone]  Estimated length of stay: 3 - 4 days  Certification:: I certify this patient will need inpatient services for at least 2 midnights       Medical History Past Medical History:  Diagnosis Date  . Diabetes mellitus without complication (HCC)   . Gout   . Hypertension     Allergies No Known Allergies  IV Location/Drains/Wounds Patient Lines/Drains/Airways Status    Active Line/Drains/Airways    Name Placement date Placement time Site Days   Peripheral IV 07/04/20 Left  Antecubital 07/04/20  1440  Antecubital  less than 1          Labs/Imaging Results for orders placed or performed during the hospital encounter of 07/04/20 (from the past 48 hour(s))  Lactic acid, plasma     Status: None   Collection Time: 07/04/20 10:53 AM  Result Value Ref Range   Lactic Acid, Venous 1.5 0.5 - 1.9 mmol/L    Comment: Performed at [facility], [address] [place], [place] [address]  Comprehensive metabolic panel     Status: Abnormal   Collection Time: 07/04/20 10:56 AM  Result Value Ref Range   Sodium 133 (L) 135 - 145 mmol/L   Potassium 4.7 3.5 - 5.1 mmol/L   Chloride 99 98 - 111 mmol/L   CO2 21 (L) 22 - 32 mmol/L   Glucose, Bld 112 (H) 70 - 99 mg/dL    Comment: Glucose reference range applies only to samples taken after fasting for at least 8 hours.   BUN 39 (H) 8 - 23 mg/dL   Creatinine, Ser 4.48 (H) 0.61 - 1.24 mg/dL   Calcium 8.6 (L) 8.9 - 10.3 mg/dL   Total Protein 7.8 6.5 - 8.1 g/dL   Albumin 2.2 (L) 3.5 - 5.0 g/dL   AST 58 (H) 15 - 41 U/L   ALT 16 0 - 44 U/L   Alkaline Phosphatase 118 38 - 126 U/L  Total Bilirubin 0.6 0.3 - 1.2 mg/dL   GFR, Estimated 53 (L) >60 mL/min    Comment: (NOTE) Calculated using the CKD-EPI Creatinine Equation (2021)    Anion gap 13 5 - 15    Comment: Performed at [facility], [address] [place], [place] [address]  CBC with Differential     Status: Abnormal   Collection Time: 07/04/20 10:56 AM  Result Value Ref Range   WBC 13.5 (H) 4.0 - 10.5 K/uL   RBC 2.78 (L) 4.22 - 5.81 MIL/uL   Hemoglobin 7.6 (L) 13.0 - 17.0 g/dL   HCT 25.9 (L) 56.3 - 87.5 %   MCV 86.0 80.0 - 100.0 fL   MCH 27.3 26.0 - 34.0 pg   MCHC 31.8 30.0 - 36.0 g/dL   RDW 64.3 32.9 - 51.8 %   Platelets 677 (H) 150 - 400 K/uL   nRBC 0.0 0.0 - 0.2 %   Neutrophils Relative % 73 %   Neutro Abs 9.9 (H) 1.7 - 7.7 K/uL   Lymphocytes Relative 18 %   Lymphs Abs 2.4 0.7 - 4.0 K/uL   Monocytes Relative 7 %    Monocytes Absolute 0.9 0.1 - 1.0 K/uL   Eosinophils Relative 1 %   Eosinophils Absolute 0.1 0.0 - 0.5 K/uL   Basophils Relative 0 %   Basophils Absolute 0.1 0.0 - 0.1 K/uL   Immature Granulocytes 1 %   Abs Immature Granulocytes 0.11 (H) 0.00 - 0.07 K/uL    Comment: Performed at [facility], [address] [place], [place] [address]  Lactic acid, plasma     Status: None   Collection Time: 07/04/20  1:10 PM  Result Value Ref Range   Lactic Acid, Venous 1.2 0.5 - 1.9 mmol/L    Comment: Performed at [facility], [address] [place], [place] [address]  POC occult blood, ED     Status: Abnormal   Collection Time: 07/04/20  5:07 PM  Result Value Ref Range   Fecal Occult Bld POSITIVE (A) NEGATIVE  Vitamin B12     Status: None   Collection Time: 07/04/20  5:30 PM  Result Value Ref Range   Vitamin B-12 596 180 - 914 pg/mL    Comment: (NOTE) This assay is not validated for testing neonatal or myeloproliferative syndrome specimens for Vitamin B12 levels. Performed at [facility], [address], [place], [place] [address]   Folate     Status: None   Collection Time: 07/04/20  5:30 PM  Result Value Ref Range   Folate 11.8 >5.9 ng/mL    Comment: Performed at [facility], [address], [place], [place] [address]  Iron and TIBC     Status: Abnormal   Collection Time: 07/04/20  5:30 PM  Result Value Ref Range   Iron 11 (L) 45 - 182 ug/dL   TIBC 732 (L) 202 - 542 ug/dL   Saturation Ratios 6 (L) 17.9 - 39.5 %   UIBC 172 ug/dL    Comment: Performed at [facility], [address] [place], [place] [address]  Ferritin     Status: None   Collection Time: 07/04/20  5:30 PM  Result Value Ref Range   Ferritin 146 24 - 336 ng/mL    Comment: Performed at [facility], [address], [place], [place] [address]  Reticulocytes     Status: Abnormal   Collection Time: 07/04/20   5:30 PM  Result Value Ref Range   Retic Ct Pct 2.2 0.4 - 3.1 %   RBC. 2.74 (L) 4.22 - 5.81 MIL/uL   Retic Count, Absolute 59.2 19.0 - 186.0 K/uL   Immature Retic Fract 26.9 (H) 2.3 - 15.9 %    Comment: Performed at [facility], [address], [place], [place] [address]  Type and screen     Status: None   Collection Time: 07/04/20  5:30 PM  Result Value Ref Range   ABO/RH(D) A POS    Antibody Screen NEG    Sample Expiration      07/07/2020,2359 Performed at [facility], [address], [place], [place] [address]   Magnesium     Status: None   Collection Time: 07/04/20  5:30 PM  Result Value Ref Range   Magnesium 2.1 1.7 - 2.4 mg/dL    Comment: Performed at [facility], [address], [place], [place] [address]  TSH     Status: None   Collection Time: 07/04/20  5:30 PM  Result Value Ref Range   TSH 2.798 0.350 - 4.500 uIU/mL    Comment: Performed by a 3rd Generation assay with a functional sensitivity of <=0.01 uIU/mL. Performed at [facility], [address], [place], [place] [address]   ABO/Rh     Status: None   Collection Time: 07/04/20  8:19 PM  Result Value Ref Range   ABO/RH(D)      A POS Performed at [facility], [address], [place], [place] [address]    DG ELBOW COMPLETE RIGHT (3+VIEW)  Result Date: 07/04/2020 CLINICAL DATA:  Right elbow wound. EXAM: RIGHT ELBOW - COMPLETE 3+ VIEW COMPARISON:  None. FINDINGS: Chronic distracted fracture of the olecranon with deformity and hypertrophy of the coronoid process. Chronic radial head deformity with posterior dislocation. No acute fracture. No joint effusion. Posterior elbow soft tissue wound and few foci of subcutaneous emphysema. No definite bony destruction or periosteal reaction. IMPRESSION: 1. Posterior elbow soft tissue wound and few foci of subcutaneous emphysema. No radiographic evidence of osteomyelitis. 2. 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 Electronically Signed   By: Obie Dredge M.D.   On: 07/04/2020 20:02   MR FOOT LEFT WO CONTRAST  Result Date: 07/04/2020 CLINICAL DATA:  Chronic left foot swelling, acutely worsened over the past week. EXAM: MRI OF THE LEFT FOOT WITHOUT CONTRAST TECHNIQUE: Multiplanar, multisequence MR imaging of the left forefoot was performed. No intravenous contrast was administered. COMPARISON:  Left foot x-rays from same day. FINDINGS: Bones/Joint/Cartilage There are multiple low T2 and T1 signal soft tissue masses with associated bony erosion centered on the first, third, and fourth TMT joints, first and fifth MTP joints, first IP joint, and second and third PIP joints. No fracture or dislocation. No joint effusion. Muscles and Tendons Flexor and extensor tendons are intact. Increased T2 signal within the intrinsic muscles of the forefoot, nonspecific, but likely related to diabetic muscle changes. Soft tissue Dorsal forefoot soft tissue swelling. No fluid collection or hematoma. IMPRESSION: 1. 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 No evidence of osteomyelitis. Electronically Signed   By: Obie Dredge M.D.   On: 07/04/2020 20:09   DG Foot Complete Left  Result Date: 07/04/2020 CLINICAL DATA:  Left foot infection. EXAM: LEFT FOOT - COMPLETE 3+ VIEW COMPARISON:  None. FINDINGS: There is noted lytic destruction in multiple areas, including the distal portions of the first and  fifth metatarsals as well as most of the fifth proximal phalanx. Rounded lucencies or lytic areas are also noted in the first proximal phalanx. There is soft tissue swelling around the first and fifth metatarsophalangeal joints these findings may simply represent gout, but osteomyelitis cannot be excluded. Lytic lesion is also seen involving the distal portion of the third proximal phalanx. Dorsal soft tissue swelling is noted concerning for infection. Degenerative  changes are seen involving the talocalcaneal joint. IMPRESSION: Lytic destruction is noted in multiple areas as described above. Soft tissue swelling is noted around the first and fifth metatarsophalangeal joints. Lytic lesion is also seen involving the distal portion of the third proximal phalanx. These findings may represent gout, but osteomyelitis must be considered. Further evaluation with MRI is recommended. Electronically Signed   By: Lupita RaiderJames  Green Jr M.D.   On: 07/04/2020 11:18    Pending Labs Unresulted Labs (From admission, onward)          Start     Ordered   07/05/20 0500  Comprehensive metabolic panel  Tomorrow morning,   R        07/04/20 1754   07/05/20 0500  CBC  Tomorrow morning,   R        07/04/20 1754   07/04/20 1609  SARS CORONAVIRUS 2 (TAT 6-24 HRS) Nasopharyngeal Nasopharyngeal Swab  (Tier 3 - Symptomatic/asymptomatic with Precautions)  Once,   STAT       Question Answer Comment  Is this test for diagnosis or screening Screening   Symptomatic for COVID-19 as defined by CDC No   Hospitalized for COVID-19 No   Admitted to ICU for COVID-19 No   Previously tested for COVID-19 No   Resident in a congregate (group) care setting No   Employed in healthcare setting No   Has patient completed COVID vaccination(s) (2 doses of Pfizer/Moderna 1 dose of [employer]) No      07/04/20 1608   07/04/20 1607  Blood culture (routine x 2)  BLOOD CULTURE X 2,   STAT      07/04/20 1607          Vitals/Pain Today's Vitals   07/04/20 1812 07/04/20 1812 07/04/20 1908 07/04/20 2004  BP:  (!) 169/92  (!) 125/55  Pulse:  63  65  Resp:  18  18  Temp:    97.8 F (36.6 C)  TempSrc:    Oral  SpO2:  98%  100%  Weight:   49.9 kg   PainSc: 0-No pain   0-No pain    Isolation Precautions Airborne and Contact precautions  Medications Medications  0.9 %  sodium chloride infusion ( Intravenous New Bag/Given 07/04/20 1816)  acetaminophen (TYLENOL) tablet 650 mg (has no  administration in time range)    Or  acetaminophen (TYLENOL) suppository 650 mg (has no administration in time range)  oxyCODONE (Oxy IR/ROXICODONE) immediate release tablet 5 mg (has no administration in time range)  ondansetron (ZOFRAN) tablet 4 mg (has no administration in time range)    Or  ondansetron (ZOFRAN) injection 4 mg (has no administration in time range)  pantoprazole (PROTONIX) injection 40 mg (has no administration in time range)  gadobutrol (GADAVIST) 1 MMOL/ML injection 5 mL (has no administration in time range)  morphine 4 MG/ML injection 4 mg (4 mg Intravenous Given 07/04/20 1453)  ondansetron (ZOFRAN) injection 4 mg (4 mg Intravenous Given 07/04/20 1453)  ceFAZolin (ANCEF) IVPB 1 g/50 mL premix (0 g Intravenous Stopped 07/04/20 1725)  Mobility walks with device

## 2020-07-04 NOTE — H&P (Signed)
History and Physical    Brian Potter PZW:[id number] DOB: [birth year] DOA: 07/04/2020  PCP: Patient, No Pcp Per  Patient coming from: Home  I have personally briefly reviewed patient's old medical records in [facility] Link  Chief Complaint: Pain in right elbow and left leg  HPI: Brian Potter is a 87 y.o. male with past medical history significant for CKD stage IIIa presents to ED with complaint of pain in the right elbow and left lower extremity.  Patient speaks [place].  His granddaughter is at the bedside functioning as Nurse, learning disability.  According to her, patient started having right elbow as well as left ankle and lower extremity pain more than a week ago.  He went to see orthopedics about a week ago and was prescribed indomethacin and doxycycline.  Over the course of last 1 week, his right elbow has decreased in size and he has had significant drainage from that but left ankle/foot area has continued to swell and painful and tender to the point that he is unable to walk.  No fever, chills or sweating or any recent travel.  According to granddaughter, patient does not like to go to hospitals or doctors.  He is not on any medications either.  No history of melena or hematochezia.  ED Course: Upon arrival to ED, patient hemodynamically stable.  Mild leukocytosis on CBC.  Normal lactic acid.  Creatinine at his baseline.  Hemoglobin 7.6 while it was 11.8 in December 2020.  X-ray left foot suspecting lytic lesions or osteomyelitis.  MRI of the left lower extremity ordered.  Hospital service were consulted.  No imaging study obtained for right elbow.  Covid test pending.  Review of Systems: As per HPI otherwise negative.    Past Medical History:  Diagnosis Date  . Diabetes mellitus without complication (HCC)   . Gout   . Hypertension     Past Surgical History:  Procedure Laterality Date  . HAND SURGERY       reports that he quit smoking about 11 years ago. His smoking use included pipe. He has never  used smokeless tobacco. He reports current alcohol use. He reports that he does not use drugs.  No Known Allergies  History reviewed. No pertinent family history.  Prior to Admission medications   Medication Sig Start Date End Date Taking? Authorizing Provider  doxycycline (VIBRAMYCIN) 100 MG capsule Take 100 mg by mouth 2 (two) times daily. 06/27/20   [provider]  indomethacin (INDOCIN) 50 MG capsule Take 1 capsule (50 mg total) by mouth 3 (three) times daily as needed for moderate pain. 04/22/19   Milagros Loll, MD    Physical Exam: Vitals:   07/04/20 1044 07/04/20 1339 07/04/20 1558  BP: (!) 145/62 (!) 147/50 (!) 173/62  Pulse: 74 70 63  Resp: 17 18 18   Temp: 98.4 F (36.9 C)    TempSrc: Oral    SpO2: 100% 100% 98%    Constitutional: NAD, calm, comfortable Vitals:   07/04/20 1044 07/04/20 1339 07/04/20 1558  BP: (!) 145/62 (!) 147/50 (!) 173/62  Pulse: 74 70 63  Resp: 17 18 18   Temp: 98.4 F (36.9 C)    TempSrc: Oral    SpO2: 100% 100% 98%   Eyes: PERRL, lids and conjunctivae normal ENMT: Mucous membranes are moist. Posterior pharynx clear of any exudate or lesions.Normal dentition.  Neck: normal, supple, no masses, no thyromegaly Respiratory: clear to auscultation bilaterally, no wheezing, no crackles. Normal respiratory effort. No accessory muscle use.  Cardiovascular: Regular rate and rhythm, no murmurs / rubs / gallops. No extremity edema. 2+ pedal pulses. No carotid bruits.  Abdomen: no tenderness, no masses palpated. No hepatosplenomegaly. Bowel sounds positive.  Musculoskeletal: Swelling in the right elbow with mild tenderness and open draining ulcer.  Swelling around left ankle, lower one third of the left lower extremity and foot.  Tenderness to palpation. Skin: no rashes, lesions, ulcers. No induration Neurologic: CN 2-12 grossly intact. Sensation intact, DTR normal. Strength 5/5 in all 4.     Labs on Admission: I have personally reviewed  following labs and imaging studies  CBC: Recent Labs  Lab 07/04/20 1056  WBC 13.5*  NEUTROABS 9.9*  HGB 7.6*  HCT 23.9*  MCV 86.0  PLT 677*   Basic Metabolic Panel: Recent Labs  Lab 07/04/20 1056  NA 133*  K 4.7  CL 99  CO2 21*  GLUCOSE 112*  BUN 39*  CREATININE 1.28*  CALCIUM 8.6*   GFR: CrCl cannot be calculated (Unknown ideal weight.). Liver Function Tests: Recent Labs  Lab 07/04/20 1056  AST 58*  ALT 16  ALKPHOS 118  BILITOT 0.6  PROT 7.8  ALBUMIN 2.2*   No results for input(s): LIPASE, AMYLASE in the last 168 hours. No results for input(s): AMMONIA in the last 168 hours. Coagulation Profile: No results for input(s): INR, PROTIME in the last 168 hours. Cardiac Enzymes: No results for input(s): CKTOTAL, CKMB, CKMBINDEX, TROPONINI in the last 168 hours. BNP (last 3 results) No results for input(s): PROBNP in the last 8760 hours. HbA1C: No results for input(s): HGBA1C in the last 72 hours. CBG: No results for input(s): GLUCAP in the last 168 hours. Lipid Profile: No results for input(s): CHOL, HDL, LDLCALC, TRIG, CHOLHDL, LDLDIRECT in the last 72 hours. Thyroid Function Tests: No results for input(s): TSH, T4TOTAL, FREET4, T3FREE, THYROIDAB in the last 72 hours. Anemia Panel: No results for input(s): VITAMINB12, FOLATE, FERRITIN, TIBC, IRON, RETICCTPCT in the last 72 hours. Urine analysis: No results found for: COLORURINE, APPEARANCEUR, LABSPEC, PHURINE, GLUCOSEU, HGBUR, BILIRUBINUR, KETONESUR, PROTEINUR, UROBILINOGEN, NITRITE, LEUKOCYTESUR  Radiological Exams on Admission: DG Foot Complete Left  Result Date: 07/04/2020 CLINICAL DATA:  Left foot infection. EXAM: LEFT FOOT - COMPLETE 3+ VIEW COMPARISON:  None. FINDINGS: There is noted lytic destruction in multiple areas, including the distal portions of the first and fifth metatarsals as well as most of the fifth proximal phalanx. Rounded lucencies or lytic areas are also noted in the first proximal  phalanx. There is soft tissue swelling around the first and fifth metatarsophalangeal joints these findings may simply represent gout, but osteomyelitis cannot be excluded. Lytic lesion is also seen involving the distal portion of the third proximal phalanx. Dorsal soft tissue swelling is noted concerning for infection. Degenerative changes are seen involving the talocalcaneal joint. IMPRESSION: Lytic destruction is noted in multiple areas as described above. Soft tissue swelling is noted around the first and fifth metatarsophalangeal joints. Lytic lesion is also seen involving the distal portion of the third proximal phalanx. These findings may represent gout, but osteomyelitis must be considered. Further evaluation with MRI is recommended. Electronically Signed   By: Lupita Raider M.D.   On: 07/04/2020 11:18    Assessment/Plan Active Problems:   Osteomyelitis of ankle or foot, acute (HCC)   Normocytic anemia/possible upper GI bleed: Patient's last known hemoglobin was 11.8 in December 2020 and now down to 7.6.  No history of melena but he is positive FOBT in the ED.  Will start him on Protonix IV twice daily.  Recheck hemoglobin in the morning.  I have consulted [facility] and spoke to Dr. Ewing Schlein who will see patient tomorrow.  Lactic lesions vs osteomyelitis of left foot area: X-ray of the left foot shows lytic areas and osteomyelitis at multiple areas of the left foot.  MRI left lower extremity pending.  Patient has received cefazolin in the ED.  I have also ordered x-ray of the right elbow.  I suspect that he probably has right elbow osteomyelitis.  Holding off to further antibiotics as patient may need bone biopsy to make proper diagnosis.  We will need to consult ID and orthopedics in the morning once imaging results are available.  I am also concerned that the combination of anemia and lytic lesions could very well be a sign of occult malignancy not known yet.  DVT prophylaxis: enoxaparin (LOVENOX)  injection 40 mg Start: 07/04/20 1800 Code Status: DNR, per granddaughter Family Communication: Granddaughter present at bedside.  Disposition Plan: To be determined Consults called: GI Admission status: Inpatient   Status is: Inpatient  Remains inpatient appropriate because:Ongoing diagnostic testing needed not appropriate for outpatient work up   Dispo: The patient is from: Home              Anticipated d/c is to: Home              Anticipated d/c date is: 2 days              Patient currently is not medically stable to d/c.   Difficult to place patient No       Hughie Closs MD [facility]  07/04/2020, 5:55 PM  To contact the attending provider between 7A-7P or the covering provider during after hours 7P-7A, please log into the web site [email or web address]

## 2020-07-04 NOTE — ED Notes (Signed)
Patient refused Covid swab. MD in room and explained to daughter that this must be done prior to admission.

## 2020-07-04 NOTE — ED Notes (Signed)
Patient has removed his IV, BP cuff and pulse ox. He states that he wants to go home to his daughter

## 2020-07-04 NOTE — ED Provider Notes (Signed)
  Physical Exam  BP (!) 173/62   Pulse 63   Temp 98.4 F (36.9 C) (Oral)   Resp 18   SpO2 98%   Physical Exam  ED Course/Procedures     Procedures  MDM   87 year old male brought into the ER with chief complaint of foot pain. He has been treated for gout and has been on doxycycline by Ortho.  Patient has had new foot swelling and increased weakness. Dr. Particia Nearing saw the patient.  Her work-up included x-ray of the left foot, where there is some lytic lesions. Concerns for osteomyelitis. MRI has been ordered. Clinically the patient is not septic. Ancef has been ordered.  Patient is noted to have anemia. He denies any bloody stools. He is guaiac positive. No melena. No abdominal pain. Never had a colonoscopy before.  Patient's weakness could be related to anemia as well. He is denying any fevers, sweats. Positive weight loss, positive loss of appetite.  We will initiate anemia work-up. MRI has been ordered, pending. Medicine consulted for admission.       Derwood Kaplan, MD 07/04/20 [phone]

## 2020-07-04 NOTE — ED Notes (Signed)
Spoke with pt grandson who stated his grandfather is ok to be admitted and start IV medications.

## 2020-07-04 NOTE — Progress Notes (Signed)
Failed attempt at MRI. Attempted to reach RN unable to reach her at this time. Will attempt later.

## 2020-07-04 NOTE — ED Notes (Signed)
Contacted provider on call for floor coverage per family member's request.

## 2020-07-04 NOTE — ED Triage Notes (Addendum)
Per EMS-left foot/ankle infection-states same symptoms to right ankle a couple of weeks ago, treated with antibiotics-difficulty ambulating-states PCP Dr Carlean Jews saw him last week and placed on antibiotic and gout-had an appointment this am but missed it due to

## 2020-07-04 NOTE — ED Provider Notes (Signed)
[facility]-EMERGENCY DEPT Provider Note   CSN: [id number] Arrival date & time: 07/04/20  1034     History Chief Complaint  Patient presents with  . Foot Pain    [facility] Potter is a 87 y.o. male.  Pt presents to the ED today with left foot pain.  The pt's granddaughter is the translator as he speaks a Geographical information systems officer.  The granddaughter said he has also had redness to his right elbow and to his right foot.  He saw Dr. Turner Daniels who put him on doxycycline and indocin.  The granddaughter said those areas have improved, but his left foot is now red and swollen.  He has not had any fevers.  He has not been able to walk due to the pain.        Past Medical History:  Diagnosis Date  . Diabetes mellitus without complication (HCC)   . Gout   . Hypertension     There are no problems to display for this patient.   Past Surgical History:  Procedure Laterality Date  . HAND SURGERY         No family history on file.  Social History   Tobacco Use  . Smoking status: Never Smoker  Substance Use Topics  . Alcohol use: Yes    Home Medications Prior to Admission medications   Medication Sig Start Date End Date Taking? Authorizing Provider  indomethacin (INDOCIN) 50 MG capsule Take 1 capsule (50 mg total) by mouth 3 (three) times daily as needed for moderate pain. 04/22/19   Milagros Loll, MD    Allergies    Patient has no known allergies.  Review of Systems   Review of Systems  Musculoskeletal:       Left foot pain and redness  All other systems reviewed and are negative.   Physical Exam Updated Vital Signs BP (!) 173/62   Pulse 63   Temp 98.4 F (36.9 C) (Oral)   Resp 18   SpO2 98%   Physical Exam Vitals and nursing note reviewed.  HENT:     Head: Normocephalic and atraumatic.     Right Ear: External ear normal.     Left Ear: External ear normal.     Nose: Nose normal.     Mouth/Throat:     Mouth: Mucous membranes are dry.  Eyes:      Extraocular Movements: Extraocular movements intact.     Conjunctiva/sclera: Conjunctivae normal.     Pupils: Pupils are equal, round, and reactive to light.  Cardiovascular:     Rate and Rhythm: Normal rate and regular rhythm.     Pulses: Normal pulses.     Heart sounds: Normal heart sounds.  Pulmonary:     Effort: Pulmonary effort is normal.     Breath sounds: Normal breath sounds.  Abdominal:     General: Abdomen is flat. Bowel sounds are normal.     Palpations: Abdomen is soft.  Musculoskeletal:     Cervical back: Normal range of motion and neck supple.     Comments: Right elbow with a chronic deformity due to injury in the [place] war.  His right elbow bursa with a draining abscess. Left foot with redness and swelling.  Tender to palpation.  Skin:    Capillary Refill: Capillary refill takes less than 2 seconds.  Neurological:     Mental Status: He is alert. Mental status is at baseline.  Psychiatric:        Mood and  Affect: Mood normal.        Behavior: Behavior normal.        Thought Content: Thought content normal.        Judgment: Judgment normal.     ED Results / Procedures / Treatments   Labs (all labs ordered are listed, but only abnormal results are displayed) Labs Reviewed  COMPREHENSIVE METABOLIC PANEL - Abnormal; Notable for the following components:      Result Value   Sodium 133 (*)    CO2 21 (*)    Glucose, Bld 112 (*)    BUN 39 (*)    Creatinine, Ser 1.28 (*)    Calcium 8.6 (*)    Albumin 2.2 (*)    AST 58 (*)    GFR, Estimated 53 (*)    All other components within normal limits  CBC WITH DIFFERENTIAL/PLATELET - Abnormal; Notable for the following components:   WBC 13.5 (*)    RBC 2.78 (*)    Hemoglobin 7.6 (*)    HCT 23.9 (*)    Platelets 677 (*)    Neutro Abs 9.9 (*)    Abs Immature Granulocytes 0.11 (*)    All other components within normal limits  CULTURE, BLOOD (ROUTINE X 2)  CULTURE, BLOOD (ROUTINE X 2)  SARS CORONAVIRUS 2 (TAT  6-24 HRS)  LACTIC ACID, PLASMA  LACTIC ACID, PLASMA  VITAMIN B12  FOLATE  IRON AND TIBC  FERRITIN  RETICULOCYTES  POC OCCULT BLOOD, ED  TYPE AND SCREEN    EKG None  Radiology DG Foot Complete Left  Result Date: 07/04/2020 CLINICAL DATA:  Left foot infection. EXAM: LEFT FOOT - COMPLETE 3+ VIEW COMPARISON:  None. FINDINGS: There is noted lytic destruction in multiple areas, including the distal portions of the first and fifth metatarsals as well as most of the fifth proximal phalanx. Rounded lucencies or lytic areas are also noted in the first proximal phalanx. There is soft tissue swelling around the first and fifth metatarsophalangeal joints these findings may simply represent gout, but osteomyelitis cannot be excluded. Lytic lesion is also seen involving the distal portion of the third proximal phalanx. Dorsal soft tissue swelling is noted concerning for infection. Degenerative changes are seen involving the talocalcaneal joint. IMPRESSION: Lytic destruction is noted in multiple areas as described above. Soft tissue swelling is noted around the first and fifth metatarsophalangeal joints. Lytic lesion is also seen involving the distal portion of the third proximal phalanx. These findings may represent gout, but osteomyelitis must be considered. Further evaluation with MRI is recommended. Electronically Signed   By: Lupita Raider M.D.   On: 07/04/2020 11:18    Procedures Procedures   Medications Ordered in ED Medications  morphine 4 MG/ML injection 4 mg (4 mg Intravenous Given 07/04/20 1453)  ondansetron (ZOFRAN) injection 4 mg (4 mg Intravenous Given 07/04/20 1453)  ceFAZolin (ANCEF) IVPB 1 g/50 mL premix (1 g Intravenous New Bag/Given 07/04/20 1451)    ED Course  I have reviewed the triage vital signs and the nursing notes.  Pertinent labs & imaging results that were available during my care of the patient were reviewed by me and considered in my medical decision making (see chart  for details).    MDM Rules/Calculators/A&P                          X-ray of foot obtained which shows multiple lytic lesions.  MRI ordered.  Pt given IV ancef and  morphine/zofran.   Pt's hgb came back low.    Pt signed out to Dr. Rhunette Croft at shift change.   Final Clinical Impression(s) / ED Diagnoses Final diagnoses:  Infection  Cellulitis of foot  Anemia, unspecified type    Rx / DC Orders ED Discharge Orders    None       Jacalyn Lefevre, MD 07/04/20 [phone]

## 2020-07-05 DIAGNOSIS — L03119 Cellulitis of unspecified part of limb: Secondary | ICD-10-CM

## 2020-07-05 LAB — CBC
HCT: 19.8 % — ABNORMAL LOW (ref 39.0–52.0)
Hemoglobin: 6.5 g/dL — CL (ref 13.0–17.0)
MCH: 27.3 pg (ref 26.0–34.0)
MCHC: 32.8 g/dL (ref 30.0–36.0)
MCV: 83.2 fL (ref 80.0–100.0)
Platelets: 566 10*3/uL — ABNORMAL HIGH (ref 150–400)
RBC: 2.38 MIL/uL — ABNORMAL LOW (ref 4.22–5.81)
RDW: 14.4 % (ref 11.5–15.5)
WBC: 12.1 10*3/uL — ABNORMAL HIGH (ref 4.0–10.5)
nRBC: 0 % (ref 0.0–0.2)

## 2020-07-05 LAB — COMPREHENSIVE METABOLIC PANEL
ALT: 14 U/L (ref 0–44)
AST: 43 U/L — ABNORMAL HIGH (ref 15–41)
Albumin: 2.1 g/dL — ABNORMAL LOW (ref 3.5–5.0)
Alkaline Phosphatase: 110 U/L (ref 38–126)
Anion gap: 7 (ref 5–15)
BUN: 32 mg/dL — ABNORMAL HIGH (ref 8–23)
CO2: 22 mmol/L (ref 22–32)
Calcium: 8 mg/dL — ABNORMAL LOW (ref 8.9–10.3)
Chloride: 104 mmol/L (ref 98–111)
Creatinine, Ser: 1.38 mg/dL — ABNORMAL HIGH (ref 0.61–1.24)
GFR, Estimated: 49 mL/min — ABNORMAL LOW (ref >60)
Glucose, Bld: 90 mg/dL (ref 70–99)
Potassium: 4.6 mmol/L (ref 3.5–5.1)
Sodium: 133 mmol/L — ABNORMAL LOW (ref 135–145)
Total Bilirubin: 0.6 mg/dL (ref 0.3–1.2)
Total Protein: 6.9 g/dL (ref 6.5–8.1)

## 2020-07-05 LAB — SARS CORONAVIRUS 2 (TAT 6-24 HRS): SARS Coronavirus 2: NEGATIVE

## 2020-07-05 LAB — SEDIMENTATION RATE: Sed Rate: >140 mm/hr — ABNORMAL HIGH (ref 0–16)

## 2020-07-05 LAB — PREPARE RBC (CROSSMATCH)

## 2020-07-05 LAB — URIC ACID: Uric Acid, Serum: 7.2 mg/dL (ref 3.7–8.6)

## 2020-07-05 MED ORDER — PANTOPRAZOLE SODIUM 40 MG PO TBEC
40.0000 mg | DELAYED_RELEASE_TABLET | Freq: Two times a day (BID) | ORAL | Status: DC
  Administered 2020-07-05 – 2020-07-07 (×5): 40 mg via ORAL
  Filled 2020-07-05 (×5): qty 1

## 2020-07-05 MED ORDER — COLCHICINE 0.6 MG PO TABS
0.6000 mg | ORAL_TABLET | Freq: Two times a day (BID) | ORAL | Status: DC
  Administered 2020-07-05 – 2020-07-07 (×5): 0.6 mg via ORAL
  Filled 2020-07-05 (×5): qty 1

## 2020-07-05 MED ORDER — ADULT MULTIVITAMIN W/MINERALS CH
1.0000 | ORAL_TABLET | Freq: Every day | ORAL | Status: DC
  Administered 2020-07-06 – 2020-07-07 (×2): 1 via ORAL
  Filled 2020-07-05 (×3): qty 1

## 2020-07-05 MED ORDER — SODIUM CHLORIDE 0.9% IV SOLUTION: Freq: Once | INTRAVENOUS | Status: AC

## 2020-07-05 MED ORDER — ENSURE ENLIVE PO LIQD
237.0000 mL | Freq: Two times a day (BID) | ORAL | Status: DC
  Administered 2020-07-05 – 2020-07-07 (×5): 237 mL via ORAL

## 2020-07-05 MED ORDER — PANTOPRAZOLE SODIUM 40 MG IV SOLR: 40.0000 mg | Freq: Two times a day (BID) | INTRAVENOUS | Status: DC

## 2020-07-05 NOTE — Progress Notes (Signed)
Patient is having a family member stay the night because of a language barrier and confusion.  The interpreter does not have the language built in.  This was approved by the [facility].

## 2020-07-05 NOTE — Progress Notes (Signed)
Initial Nutrition Assessment  INTERVENTION:   -Ensure Enlive po BID, each supplement provides 350 kcal and 20 grams of protein  -Multivitamin with minerals daily  NUTRITION DIAGNOSIS:   Increased nutrient needs related to acute illness as evidenced by estimated needs.  GOAL:   Patient will meet greater than or equal to 90% of their needs  MONITOR:   PO intake,Supplement acceptance,Labs,Weight trends,I & O's,Skin  REASON FOR ASSESSMENT:   Malnutrition Screening Tool    ASSESSMENT:   87 y.o. male with past medical history significant for CKD stage IIIa presents to ED with complaint of pain in the right elbow and left lower extremity.  2/22: X-ray of the left foot shows lytic areas and osteomyelitis at multiple areas of the left foot.  Patient is non-english speaking. Family translates.  Per family, pt has had poor appetite for 2 weeks PTA. Will order Ensure supplements for additional kcals and protein.  Pt reporting 10 lbs of weight loss over the past month. No weight records in chart. Per nursing documentation, pt with moderate LLE edema.  Medications: Multivitamin with minerals daily  Labs reviewed: Low Na  NUTRITION - FOCUSED PHYSICAL EXAM:  Deferred.  Diet Order:   Diet Order            Diet regular Room service appropriate? Yes; Fluid consistency: Thin  Diet effective now                 EDUCATION NEEDS:   No education needs have been identified at this time  Skin:  Skin Assessment: Reviewed RN Assessment  Last BM:  PTA  Height:   Ht Readings from Last 1 Encounters:  No data found for Ht    Weight:   Wt Readings from Last 1 Encounters:  07/04/20 49.9 kg     BMI:  There is no height or weight on file to calculate BMI.  Estimated Nutritional Needs:   Kcal:  1350-1550  Protein:  60-70g  Fluid:  1.6L/day  Tilda Franco, MS, RD, LDN Inpatient Clinical Dietitian Contact information available via Amion

## 2020-07-05 NOTE — Progress Notes (Signed)
Pt pulled his IV out. Pt refused to let IV team place new IV. Pt refused labs this AM. Pt refused to let RN place dressing on elbow wound. Pt grandson at bedside translating. Grandson requesting to switch out with another family member who also speaks english.

## 2020-07-05 NOTE — Progress Notes (Signed)
Responded to consult for IV. Grandson in room with pt. Explained order for IV fluids and medication. Grandson requested to hold on IV until later time.

## 2020-07-05 NOTE — Progress Notes (Signed)
PT Cancellation Note  Patient Details Name: Brian [facility] MRN: [id number] DOB: [birth year]   Cancelled Treatment:    Reason Eval/Treat Not Completed: Medical issues which prohibited therapy (Pt's H&H low 6.5 and 19.8; pt to recieve 2 units of RBC. Will follow up at later date/time as schedule allows and pt able.)  Wynn Maudlin, DPT Acute Rehabilitation Services Office [phone] Pager [phone]    Anitra Lauth 07/05/2020, 3:38 PM

## 2020-07-05 NOTE — Consult Note (Signed)
Referring Provider: [facility] Primary Care Physician:  Patient, No Pcp Per Primary Gastroenterologist:  Unassigned  Reason for Consultation:  Anemia, heme-positive stools  HPI: Isom Kochan is a 87 y.o. male with history of DM and gout, currently hospitalized for osteomyelitis presenting for consultation of anemia and heme-positive stools.  Patient does not speak [place], and interpreter for his language/dialect is not available via video translation.  His granddaughter, at bedside, provides translation and history.  Patient is very lethargic and not interactive during conversation/interview.  He does report some abdominal pain, though does not respond to other ROS questions.  Per patient's granddaughter, he has been weak over the last 2 weeks, whereas he was previously very independent and able to do all ADLs by himself.  She is unaware of any stool changes, and states patient reports normal stools.  She does not believe he has had any recent nausea/vomiting.  He has lost 10 pounds over the last month.  Patient's granddaughter states the patient takes "a lot" of anti-inflammatories for joint pain, such as Aleve and ibuprofen.  He was also recently prescribed indomethacin for gout.  He has never had an EGD or colonoscopy.  Past Medical History:  Diagnosis Date   Diabetes mellitus without complication (HCC)    Gout    Hypertension     Past Surgical History:  Procedure Laterality Date   HAND SURGERY      Prior to Admission medications   Medication Sig Start Date End Date Taking? Authorizing Provider  doxycycline (VIBRAMYCIN) 100 MG capsule Take 100 mg by mouth 2 (two) times daily. 06/27/20  Yes [provider]  indomethacin (INDOCIN) 50 MG capsule Take 1 capsule (50 mg total) by mouth 3 (three) times daily as needed for moderate pain. 04/22/19  Yes Milagros Loll, MD    Scheduled Meds:  colchicine  0.6 mg Oral BID   pantoprazole (PROTONIX) IV  40 mg Intravenous Q12H    Continuous Infusions:  sodium chloride Stopped (07/04/20 2349)   PRN Meds:.acetaminophen **OR** acetaminophen, gadobutrol, ondansetron **OR** ondansetron (ZOFRAN) IV, oxyCODONE  Allergies as of 07/04/2020   (No Known Allergies)    History reviewed. No pertinent family history.  Social History   Socioeconomic History   Marital status: Married    Spouse name: Not on file   Number of children: Not on file   Years of education: Not on file   Highest education level: Not on file  Occupational History   Not on file  Tobacco Use   Smoking status: Former Smoker    Types: Pipe    Quit date: 07/04/2009    Years since quitting: 11.0   Smokeless tobacco: Never Used  Vaping Use   Vaping Use: Never used  Substance and Sexual Activity   Alcohol use: Yes    Comment: 1 bottle most days   Drug use: Never   Sexual activity: Not on file  Other Topics Concern   Not on file  Social History Narrative   Not on file   Social Determinants of Health   Financial Resource Strain: Not on file  Food Insecurity: Not on file  Transportation Needs: Not on file  Physical Activity: Not on file  Stress: Not on file  Social Connections: Not on file  Intimate Partner Violence: Not on file    Review of Systems: ROS limited by patient lethargy. +abdominal pain   Physical Exam: Vital signs: Vitals:   07/05/20 0039 07/05/20 0537  BP: (!) 140/53 (!) 111/55  Pulse: 68  94  Resp: 16 16  Temp:    SpO2: 94%      Physical Exam Vitals reviewed.  Constitutional:      General: He is not in acute distress.    Appearance: He is underweight.  HENT:     Head: Normocephalic and atraumatic.     Nose: Nose normal. No congestion.     Mouth/Throat:     Mouth: Mucous membranes are moist.     Pharynx: Oropharynx is clear.  Eyes:     Extraocular Movements: Extraocular movements intact.     Comments: Conjunctival pallor  Cardiovascular:     Rate and Rhythm: Normal rate and regular  rhythm.     Pulses: Normal pulses.  Pulmonary:     Effort: Pulmonary effort is normal.     Breath sounds: Normal breath sounds.  Abdominal:     General: Bowel sounds are normal. There is no distension.     Palpations: Abdomen is soft. There is no mass.     Tenderness: There is no abdominal tenderness. There is no guarding or rebound.     Hernia: No hernia is present.  Musculoskeletal:        General: Swelling (Left foot) and tenderness (Left foot) present.     Cervical back: Normal range of motion and neck supple.  Skin:    General: Skin is warm and dry.  Neurological:     General: No focal deficit present.     Mental Status: He is oriented to person, place, and time. He is lethargic.  Psychiatric:        Mood and Affect: Mood normal.        Behavior: Behavior normal.     GI:  Lab Results: Recent Labs    07/04/20 1056  WBC 13.5*  HGB 7.6*  HCT 23.9*  PLT 677*   BMET Recent Labs    07/04/20 1056  NA 133*  K 4.7  CL 99  CO2 21*  GLUCOSE 112*  BUN 39*  CREATININE 1.28*  CALCIUM 8.6*   LFT Recent Labs    07/04/20 1056  PROT 7.8  ALBUMIN 2.2*  AST 58*  ALT 16  ALKPHOS 118  BILITOT 0.6   PT/INR No results for input(s): LABPROT, INR in the last 72 hours.   Studies/Results: DG ELBOW COMPLETE RIGHT (3+VIEW)  Result Date: 07/04/2020 CLINICAL DATA:  Right elbow wound. EXAM: RIGHT ELBOW - COMPLETE 3+ VIEW COMPARISON:  None. FINDINGS: Chronic distracted fracture of the olecranon with deformity and hypertrophy of the coronoid process. Chronic radial head deformity with posterior dislocation. No acute fracture. No joint effusion. Posterior elbow soft tissue wound and few foci of subcutaneous emphysema. No definite bony destruction or periosteal reaction. IMPRESSION: 1. Posterior elbow soft tissue wound and few foci of subcutaneous emphysema. No radiographic evidence of osteomyelitis. 2. 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  Electronically Signed   By: Obie Dredge M.D.   On: 07/04/2020 20:02   MR FOOT LEFT WO CONTRAST  Result Date: 07/04/2020 CLINICAL DATA:  Chronic left foot swelling, acutely worsened over the past week. EXAM: MRI OF THE LEFT FOOT WITHOUT CONTRAST TECHNIQUE: Multiplanar, multisequence MR imaging of the left forefoot was performed. No intravenous contrast was administered. COMPARISON:  Left foot x-rays from same day. FINDINGS: Bones/Joint/Cartilage There are multiple low T2 and T1 signal soft tissue masses with associated bony erosion centered on the first, third, and fourth TMT joints, first and fifth MTP joints, first IP joint,  and second and third PIP joints. No fracture or dislocation. No joint effusion. Muscles and Tendons Flexor and extensor tendons are intact. Increased T2 signal within the intrinsic muscles of the forefoot, nonspecific, but likely related to diabetic muscle changes. Soft tissue Dorsal forefoot soft tissue swelling. No fluid collection or hematoma. IMPRESSION: 1. 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 No evidence of osteomyelitis. Electronically Signed   By: Obie Dredge M.D.   On: 07/04/2020 20:09   DG Foot Complete Left  Result Date: 07/04/2020 CLINICAL DATA:  Left foot infection. EXAM: LEFT FOOT - COMPLETE 3+ VIEW COMPARISON:  None. FINDINGS: There is noted lytic destruction in multiple areas, including the distal portions of the first and fifth metatarsals as well as most of the fifth proximal phalanx. Rounded lucencies or lytic areas are also noted in the first proximal phalanx. There is soft tissue swelling around the first and fifth metatarsophalangeal joints these findings may simply represent gout, but osteomyelitis cannot be excluded. Lytic lesion is also seen involving the distal portion of the third proximal phalanx. Dorsal soft tissue swelling is noted concerning for infection. Degenerative changes are seen involving the talocalcaneal joint. IMPRESSION:  Lytic destruction is noted in multiple areas as described above. Soft tissue swelling is noted around the first and fifth metatarsophalangeal joints. Lytic lesion is also seen involving the distal portion of the third proximal phalanx. These findings may represent gout, but osteomyelitis must be considered. Further evaluation with MRI is recommended. Electronically Signed   By: Lupita Raider M.D.   On: 07/04/2020 11:18    Impression: Anemia, FOBT positive stools: concern for PUD vs. Gastritis in the setting of NSAID use, though malignancy remains on the differential. -Hemoglobin 7.8 yesterday, decreased from 11.8 in 04/2019 (no more recent Hgb on file) -BUN elevated to 39 with Cr 1.28, concerning for upper GI bleeding -Normal ferritin but low serum iron and iron saturation  Plan: Detailed discussion with granddaughter at bedside.  She states that patient does not want to undergo any procedures, and family prefers further conservative management as well given patient's age.  Discussed potential for ulcers/gastritis, with small chance of malignancy as well.  Patient has pulled out his IV multiple times as well.  Because of this, we will proceed with conservative management, which was discussed with patient's granddaughter.  Protonix 40 mg p.o. twice daily (IV dosing if IV is replaced).  Continue Protonix 40 mg PO twice daily for 2 months, followed by once daily dosing indefinitely as patient will likely continue NSAIDs due to chronic pain/gout.  Continue to monitor H&H with transfusion as needed to maintain hemoglobin greater than 7.  [facility] will sign off.  Please contact us if we can be of any further assistance during his hospital stay.    LOS: 1 day   Edrick Kins  PA-C 07/05/2020, 8:39 AM  Contact #  [phone]

## 2020-07-05 NOTE — Progress Notes (Addendum)
PROGRESS NOTE    Brian Potter  JTT:[id number] DOB: Dec 19, 1928 DOA: 07/04/2020 PCP: Patient, No Pcp Per    Brief Narrative:  87 y.o. male with past medical history significant for CKD stage IIIa presents to ED with complaint of pain in the right elbow and left lower extremity.  Patient speaks [place].  His granddaughter is at the bedside functioning as Optometrist.  According to her, patient started having right elbow as well as left ankle and lower extremity pain more than a week ago.  He went to see orthopedics about a week ago and was prescribed indomethacin and doxycycline.  Over the course of last 1 week, his right elbow has decreased in size and he has had significant drainage from that but left ankle/foot area has continued to swell and painful and tender to the point that he is unable to walk.  No fever, chills or sweating or any recent travel.  According to granddaughter, patient does not like to go to hospitals or doctors.  He is not on any medications either.  No history of melena or hematochezia.  Assessment & Plan:   Active Problems:   Osteomyelitis of ankle or foot, acute (HCC)    Normocytic anemia/possible acute blood loss anemia secondary to upper GI bleed:  -Patient's last known hemoglobin was 11.8 in December 2020 and now down to 7.6.   -No history of melena but he is positive FOBT in the ED.   -GI consulted. On discussion with pt and family, pt had decided against further invasive work up, but is agreeable to meds and blood tx as needed -Repeat hgb down to 6.5. Have ordered 2 units PRBC's -Repeat cbc in AM -Protonix started per GI recs -GI has since signed off  Tophaceous gout of left foot area, osteomyelitis ruled out:  -X-ray of the left foot shows lytic areas and osteomyelitis at multiple areas of the left foot.   -MRI left lower extremity personally reviewed, findings consistent for extensive tophaceous gout involving L foot -Uric acid is w/in normal limits, but can  be normal in acute gout flare -ESR is markedly elevated at >140 -Have started BID colchicine -Will hold further abx -Have consulted PT/OT  DVT prophylaxis: SCD's Code Status: DNR Family Communication: Pt in room, family currently not at bedside  Status is: Inpatient  Remains inpatient appropriate because:Ongoing active pain requiring inpatient pain management and Unsafe d/c plan   Dispo: The patient is from: Home              Anticipated d/c is to: Pending PT eval              Anticipated d/c date is: 2 days              Patient currently is not medically stable to d/c.   Difficult to place patient No       Consultants:   GI  Procedures:     Antimicrobials: Anti-infectives (From admission, onward)   Start     Dose/Rate Route Frequency Ordered Stop   07/04/20 1400  ceFAZolin (ANCEF) IVPB 1 g/50 mL premix        1 g 100 mL/hr over 30 Minutes Intravenous  Once 07/04/20 1355 07/04/20 1725       Subjective: Unable to assess as pt is not very communicative  Objective: Vitals:   07/05/20 0537 07/05/20 1413 07/05/20 1448 07/05/20 1728  BP: (!) 111/55 (!) 110/50 (!) 128/55 (!) 122/49  Pulse: 94 87 92 84  Resp:  $'16 16 16 17  'v$ Temp:  98.8 F (37.1 C) 98.8 F (37.1 C) 98.2 F (36.8 C)  TempSrc:  Axillary Axillary Axillary  SpO2:  99% 98% 95%  Weight:        Intake/Output Summary (Last 24 hours) at 07/05/2020 1822 Last data filed at 07/05/2020 1802 Gross per 24 hour  Intake 922.71 ml  Output 100 ml  Net 822.71 ml   Filed Weights   07/04/20 1908  Weight: 49.9 kg    Examination:  General exam: Appears calm and comfortable  Respiratory system: Clear to auscultation. Respiratory effort normal. Cardiovascular system: S1 & S2 heard, regular Gastrointestinal system: Abdomen is nondistended, soft and nontender. No organomegaly or masses felt. Normal bowel sounds heard. Central nervous system: Alert. No focal neurological deficits. Extremities: Symmetric 5 x 5  power. Skin: No rashes, lesions or ulcers Psychiatry: Unable to assess given mentation.   Data Reviewed: I have personally reviewed following labs and imaging studies  CBC: Recent Labs  Lab 07/04/20 1056 07/05/20 1020  WBC 13.5* 12.1*  NEUTROABS 9.9*  --   HGB 7.6* 6.5*  HCT 23.9* 19.8*  MCV 86.0 83.2  PLT 677* 836*   Basic Metabolic Panel: Recent Labs  Lab 07/04/20 1056 07/04/20 1730 07/05/20 1020  NA 133*  --  133*  K 4.7  --  4.6  CL 99  --  104  CO2 21*  --  22  GLUCOSE 112*  --  90  BUN 39*  --  32*  CREATININE 1.28*  --  1.38*  CALCIUM 8.6*  --  8.0*  MG  --  2.1  --    GFR: CrCl cannot be calculated (Unknown ideal weight.). Liver Function Tests: Recent Labs  Lab 07/04/20 1056 07/05/20 1020  AST 58* 43*  ALT 16 14  ALKPHOS 118 110  BILITOT 0.6 0.6  PROT 7.8 6.9  ALBUMIN 2.2* 2.1*   No results for input(s): LIPASE, AMYLASE in the last 168 hours. No results for input(s): AMMONIA in the last 168 hours. Coagulation Profile: No results for input(s): INR, PROTIME in the last 168 hours. Cardiac Enzymes: No results for input(s): CKTOTAL, CKMB, CKMBINDEX, TROPONINI in the last 168 hours. BNP (last 3 results) No results for input(s): PROBNP in the last 8760 hours. HbA1C: No results for input(s): HGBA1C in the last 72 hours. CBG: No results for input(s): GLUCAP in the last 168 hours. Lipid Profile: No results for input(s): CHOL, HDL, LDLCALC, TRIG, CHOLHDL, LDLDIRECT in the last 72 hours. Thyroid Function Tests: Recent Labs    07/04/20 1730  TSH 2.798   Anemia Panel: Recent Labs    07/04/20 1730  VITAMINB12 596  FOLATE 11.8  FERRITIN 146  TIBC 183*  IRON 11*  RETICCTPCT 2.2   Sepsis Labs: Recent Labs  Lab 07/04/20 1053 07/04/20 1310  LATICACIDVEN 1.5 1.2    Recent Results (from the past 240 hour(s))  Blood culture (routine x 2)     Status: None (Preliminary result)   Collection Time: 07/04/20  5:26 PM   Specimen: BLOOD  Result  Value Ref Range Status   Specimen Description   Final    BLOOD LEFT ANTECUBITAL Performed at [facility], [place] [address], [place], [place] [address]    Special Requests   Final    BOTTLES DRAWN AEROBIC AND ANAEROBIC Blood Culture adequate volume Performed at [facility] [address] [place], [place] [address]    Culture   Final  NO GROWTH < 12 HOURS Performed at [facility] [address] [place], [place] [address]    Report Status PENDING  Incomplete  SARS CORONAVIRUS 2 (TAT 6-24 HRS) Nasopharyngeal Nasopharyngeal Swab     Status: None   Collection Time: 07/04/20  5:30 PM   Specimen: Nasopharyngeal Swab  Result Value Ref Range Status   SARS Coronavirus 2 NEGATIVE NEGATIVE Final    Comment: (NOTE) SARS-CoV-2 target nucleic acids are NOT DETECTED.  The SARS-CoV-2 RNA is generally detectable in upper and lower respiratory specimens during the acute phase of infection. Negative results do not preclude SARS-CoV-2 infection, do not rule out co-infections with other pathogens, and should not be used as the sole basis for treatment or other patient management decisions. Negative results must be combined with clinical observations, patient history, and epidemiological information. The expected result is Negative.  Fact Sheet for Patients: SugarRoll.be  Fact Sheet for Healthcare Providers: https://www.woods-mathews.com/  This test is not yet approved or cleared by the [place] FDA and  has been authorized for detection and/or diagnosis of SARS-CoV-2 by FDA under an Emergency Use Authorization (EUA). This EUA will remain  in effect (meaning this test can be used) for the duration of the COVID-19 declaration under Se ction 564(b)(1) of the Act, 21 U.S.C. section 360bbb-3(b)(1), unless the authorization is terminated or revoked sooner.  Performed at [facility], [place] [address], [place],  [address]      Radiology Studies: DG ELBOW COMPLETE RIGHT (3+VIEW)  Result Date: 07/04/2020 CLINICAL DATA:  Right elbow wound. EXAM: RIGHT ELBOW - COMPLETE 3+ VIEW COMPARISON:  None. FINDINGS: Chronic distracted fracture of the olecranon with deformity and hypertrophy of the coronoid process. Chronic radial head deformity with posterior dislocation. No acute fracture. No joint effusion. Posterior elbow soft tissue wound and few foci of subcutaneous emphysema. No definite bony destruction or periosteal reaction. IMPRESSION: 1. Posterior elbow soft tissue wound and few foci of subcutaneous emphysema. No radiographic evidence of osteomyelitis. 2. 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 Electronically Signed   By: Titus Dubin M.D.   On: 07/04/2020 20:02   MR FOOT LEFT WO CONTRAST  Result Date: 07/04/2020 CLINICAL DATA:  Chronic left foot swelling, acutely worsened over the past week. EXAM: MRI OF THE LEFT FOOT WITHOUT CONTRAST TECHNIQUE: Multiplanar, multisequence MR imaging of the left forefoot was performed. No intravenous contrast was administered. COMPARISON:  Left foot x-rays from same day. FINDINGS: Bones/Joint/Cartilage There are multiple low T2 and T1 signal soft tissue masses with associated bony erosion centered on the first, third, and fourth TMT joints, first and fifth MTP joints, first IP joint, and second and third PIP joints. No fracture or dislocation. No joint effusion. Muscles and Tendons Flexor and extensor tendons are intact. Increased T2 signal within the intrinsic muscles of the forefoot, nonspecific, but likely related to diabetic muscle changes. Soft tissue Dorsal forefoot soft tissue swelling. No fluid collection or hematoma. IMPRESSION: 1. 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 No evidence of osteomyelitis. Electronically Signed   By: Titus Dubin M.D.   On: 07/04/2020 20:09   DG Foot Complete Left  Result Date:  07/04/2020 CLINICAL DATA:  Left foot infection. EXAM: LEFT FOOT - COMPLETE 3+ VIEW COMPARISON:  None. FINDINGS: There is noted lytic destruction in multiple areas, including the distal portions of the first and fifth metatarsals as well as most of the fifth proximal phalanx. Rounded lucencies or lytic areas are  also noted in the first proximal phalanx. There is soft tissue swelling around the first and fifth metatarsophalangeal joints these findings may simply represent gout, but osteomyelitis cannot be excluded. Lytic lesion is also seen involving the distal portion of the third proximal phalanx. Dorsal soft tissue swelling is noted concerning for infection. Degenerative changes are seen involving the talocalcaneal joint. IMPRESSION: Lytic destruction is noted in multiple areas as described above. Soft tissue swelling is noted around the first and fifth metatarsophalangeal joints. Lytic lesion is also seen involving the distal portion of the third proximal phalanx. These findings may represent gout, but osteomyelitis must be considered. Further evaluation with MRI is recommended. Electronically Signed   By: Marijo Conception M.D.   On: 07/04/2020 11:18    Scheduled Meds: . colchicine  0.6 mg Oral BID  . feeding supplement  237 mL Oral BID BM  . multivitamin with minerals  1 tablet Oral Daily  . pantoprazole  40 mg Oral BID   Or  . pantoprazole (PROTONIX) IV  40 mg Intravenous BID   Continuous Infusions: . sodium chloride 75 mL/hr at 07/05/20 1257     LOS: 1 day   Marylu Lund, MD [facility] Pager On Amion  If 7PM-7AM, please contact night-coverage 07/05/2020, 6:22 PM

## 2020-07-06 LAB — CBC
HCT: 28.7 % — ABNORMAL LOW (ref 39.0–52.0)
Hemoglobin: 9.8 g/dL — ABNORMAL LOW (ref 13.0–17.0)
MCH: 29.1 pg (ref 26.0–34.0)
MCHC: 34.1 g/dL (ref 30.0–36.0)
MCV: 85.2 fL (ref 80.0–100.0)
Platelets: 496 10*3/uL — ABNORMAL HIGH (ref 150–400)
RBC: 3.37 MIL/uL — ABNORMAL LOW (ref 4.22–5.81)
RDW: 14.3 % (ref 11.5–15.5)
WBC: 11.5 10*3/uL — ABNORMAL HIGH (ref 4.0–10.5)
nRBC: 0 % (ref 0.0–0.2)

## 2020-07-06 LAB — COMPREHENSIVE METABOLIC PANEL
ALT: 14 U/L (ref 0–44)
AST: 42 U/L — ABNORMAL HIGH (ref 15–41)
Albumin: 2 g/dL — ABNORMAL LOW (ref 3.5–5.0)
Alkaline Phosphatase: 95 U/L (ref 38–126)
Anion gap: 8 (ref 5–15)
BUN: 33 mg/dL — ABNORMAL HIGH (ref 8–23)
CO2: 20 mmol/L — ABNORMAL LOW (ref 22–32)
Calcium: 7.9 mg/dL — ABNORMAL LOW (ref 8.9–10.3)
Chloride: 105 mmol/L (ref 98–111)
Creatinine, Ser: 1.19 mg/dL (ref 0.61–1.24)
GFR, Estimated: 58 mL/min — ABNORMAL LOW (ref >60)
Glucose, Bld: 95 mg/dL (ref 70–99)
Potassium: 4 mmol/L (ref 3.5–5.1)
Sodium: 133 mmol/L — ABNORMAL LOW (ref 135–145)
Total Bilirubin: 0.9 mg/dL (ref 0.3–1.2)
Total Protein: 6.9 g/dL (ref 6.5–8.1)

## 2020-07-06 LAB — TYPE AND SCREEN
ABO/RH(D): A POS
Antibody Screen: NEGATIVE
Unit division: 0
Unit division: 0

## 2020-07-06 LAB — BPAM RBC
Blood Product Expiration Date: 202203132359
Blood Product Expiration Date: 202203132359
ISSUE DATE / TIME: 202202231421
ISSUE DATE / TIME: 202202232014
Unit Type and Rh: 6200
Unit Type and Rh: 6200

## 2020-07-06 MED ORDER — METHYLPREDNISOLONE SODIUM SUCC 125 MG IJ SOLR
60.0000 mg | INTRAMUSCULAR | Status: AC
  Administered 2020-07-06: 10:00:00 60 mg via INTRAVENOUS
  Filled 2020-07-06: qty 2

## 2020-07-06 NOTE — Progress Notes (Signed)
Patient suffers from gout which impairs their ability to perform daily activities like ambulate in the home.  A walker alone will not resolve the issues with performing activities of daily living. A lightweight wheelchair will allow patient to safely perform daily activities.  The patient can self propel in the home or has a caregiver who can provide assistance.     Ralene Bathe Kistler PT 07/06/2020  Acute Rehabilitation Services Pager [phone] Office [phone]

## 2020-07-06 NOTE — Evaluation (Signed)
Occupational Therapy Evaluation Patient Details Name: Brian Potter MRN: [id number] DOB: 1929/01/27 Today's Date: 07/06/2020    History of Present Illness 87 y.o. male with past medical history significant for CKD stage IIIa presents to ED with complaint of pain in the right elbow and left lower extremity.  Dx of anemia possibly 2* GIB, imaging showed tophaceous gout L ankle and chronic R elbow fracture deformity.   Clinical Impression   Brian Potter is an 87 year old man who presents with gout in left foot, right elbow wound (and chronic elbow deformity) and pain resulting in patient being unable to perform baseline ADLs and mobility. Patient min assist for supine to sit and min guard to stand and pivot to recliner. Patient needed assistance for ADLs - mod assist for LB dressing. Patient predominantly limited by pain and expect patient will improve once pain does. Patient will benefit from skilled OT services while in hospital to improve deficits and activity tolerance in order to return home at discharge. Family requesting wheelchair at discharge.       Follow Up Recommendations  No OT follow up    Equipment Recommendations  Wheelchair (measurements OT)    Recommendations for Other Services       Precautions / Restrictions Precautions Precautions: Fall Precaution Comments: family denies h/o falls Restrictions Weight Bearing Restrictions: No      Mobility Bed Mobility Overal bed mobility: Needs Assistance Bed Mobility: Supine to Sit     Supine to sit: Min assist     General bed mobility comments: assist to raise trunk    Transfers Overall transfer level: Needs assistance Equipment used: Rolling walker (2 wheeled) Transfers: Stand Pivot Transfers;Sit to/from Stand Sit to Stand: Min guard Stand pivot transfers: Min guard       General transfer comment: pt maintained LLE NWB 2* pain    Balance Overall balance assessment: Needs assistance   Sitting balance-Leahy Scale:  Good     Standing balance support: Bilateral upper extremity supported Standing balance-Leahy Scale: Poor Standing balance comment: relies on BUE support                           ADL either performed or assessed with clinical judgement   ADL Overall ADL's : Needs assistance/impaired Eating/Feeding: Set up   Grooming: Set up   Upper Body Bathing: Set up   Lower Body Bathing: Set up   Upper Body Dressing : Minimal assistance   Lower Body Dressing: Moderate assistance Lower Body Dressing Details (indicate cue type and reason): to don socks, manage clothing Toilet Transfer: Min guard;Stand-pivot;BSC   Toileting- Clothing Manipulation and Hygiene: Min guard;Sitting/lateral lean               Vision   Vision Assessment?: No apparent visual deficits     Perception     Praxis      Pertinent Vitals/Pain Pain Assessment: Faces Faces Pain Scale: Hurts little more Pain Location: L ankle with movement Pain Descriptors / Indicators: Grimacing Pain Intervention(s): Limited activity within patient's tolerance;Monitored during session     Hand Dominance     Extremity/Trunk Assessment Upper Extremity Assessment Upper Extremity Assessment: RUE deficits/detail;LUE deficits/detail RUE Deficits / Details: Chronic elbow deformity - limited elbow extension, gouty changes in right hand LUE Deficits / Details: grossly functional ROM   Lower Extremity Assessment Lower Extremity Assessment: Defer to PT evaluation LLE Deficits / Details: ankle PF/DF AROM decr 75% 2* pain LLE: Unable to  fully assess due to pain LLE Sensation: WNL LLE Coordination: WNL   Cervical / Trunk Assessment Cervical / Trunk Assessment: Normal   Communication Communication Communication: Prefers language other than English;Interpreter utilized (family interpreted)   Cognition Arousal/Alertness: Awake/alert Behavior During Therapy: WFL for tasks assessed/performed Overall Cognitive Status:  Difficult to assess                                     General Comments       Exercises     Shoulder Instructions      Home Living Family/patient expects to be discharged to:: Private residence Living Arrangements: Spouse/significant other;Children;Other relatives Available Help at Discharge: Family;Available 24 hours/day   Home Access: Stairs to enter [employer] of Steps: 2         Bathroom Shower/Tub: Tub/shower unit         Home Equipment: Cane - single point          Prior Functioning/Environment Level of Independence: Independent with assistive device(s)        Comments: walked with cane for past 2 weeks        OT Problem List: Decreased activity tolerance;Pain      OT Treatment/Interventions: Self-care/ADL training;Therapeutic exercise;Patient/family education;Therapeutic activities;DME and/or AE instruction    OT Goals(Current goals can be found in the care plan section) Acute Rehab OT Goals Patient Stated Goal: DC home OT Goal Formulation: With family Time For Goal Achievement: 07/20/20 Potential to Achieve Goals: Good  OT Frequency: Min 2X/week   Barriers to D/C:            Co-evaluation PT/OT/SLP Co-Evaluation/Treatment: Yes Reason for Co-Treatment: To address functional/ADL transfers (co-eval) PT goals addressed during session: Mobility/safety with mobility;Proper use of DME        AM-PAC OT "6 Clicks" Daily Activity     Outcome Measure Help from another person eating meals?: A Little Help from another person taking care of personal grooming?: A Little Help from another person toileting, which includes using toliet, bedpan, or urinal?: A Little Help from another person bathing (including washing, rinsing, drying)?: A Little Help from another person to put on and taking off regular upper body clothing?: A Little Help from another person to put on and taking off regular lower body clothing?: A Lot 6 Click  Score: 17   End of Session Equipment Utilized During Treatment: Rolling walker Nurse Communication: Mobility status  Activity Tolerance: Patient limited by pain Patient left: in chair;with call bell/phone within reach;with family/visitor present  OT Visit Diagnosis: Unsteadiness on feet (R26.81);Pain Pain - Right/Left: Left Pain - part of body: Ankle and joints of foot                Time: 1458-1515 OT Time Calculation (min): 17 min Charges:  OT General Charges $OT Visit: 1 Visit OT Evaluation $OT Eval Low Complexity: 1 Low  Cheryllynn Sarff, OTR/L Acute Care Rehab Services  Office [phone] Pager: [phone]   Kelli Churn 07/06/2020, 4:09 PM

## 2020-07-06 NOTE — Evaluation (Addendum)
Physical Therapy Evaluation Patient Details Name: Brian Potter MRN: [id number] DOB: 06-06-1928 Today's Date: 07/06/2020   History of Present Illness  87 y.o. male with past medical history significant for CKD stage IIIa presents to ED with complaint of pain in the right elbow and left lower extremity.  Dx of anemia possibly 2* GIB, imaging showed tophaceous gout L ankle and chronic R elbow fracture deformity.  Clinical Impression  Pt admitted with above diagnosis. Min assist for bed mobility, min/guard for stand pivot transfer to recliner with RW, pt maintained LLE in non weight bearing 2* pain. Pt currently with functional limitations due to the deficits listed below (see PT Problem List). Pt will benefit from skilled PT to increase their independence and safety with mobility to allow discharge to the venue listed below.       Follow Up Recommendations Home health PT;Supervision for mobility/OOB    Equipment Recommendations  Wheelchair cushion (measurements PT);Wheelchair (measurements PT);Rolling walker with 5" wheels (family to obtain RW privately)   Recommendations for Other Services       Precautions / Restrictions Precautions Precautions: Fall Precaution Comments: family denies h/o falls Restrictions Weight Bearing Restrictions: No      Mobility  Bed Mobility Overal bed mobility: Needs Assistance Bed Mobility: Supine to Sit     Supine to sit: Min assist     General bed mobility comments: assist to raise trunk    Transfers Overall transfer level: Needs assistance Equipment used: Rolling walker (2 wheeled) Transfers: Stand Pivot Transfers;Sit to/from Stand Sit to Stand: Min guard Stand pivot transfers: Min guard       General transfer comment: pt maintained LLE NWB 2* pain  Ambulation/Gait                Stairs            Wheelchair Mobility    Modified Rankin (Stroke Patients Only)       Balance Overall balance assessment: Needs assistance    Sitting balance-Leahy Scale: Good     Standing balance support: Bilateral upper extremity supported Standing balance-Leahy Scale: Poor Standing balance comment: relies on BUE support                             Pertinent Vitals/Pain Pain Assessment: Faces Faces Pain Scale: Hurts little more Pain Location: L ankle with movement Pain Descriptors / Indicators: Grimacing Pain Intervention(s): Limited activity within patient's tolerance;Monitored during session    Home Living Family/patient expects to be discharged to:: Private residence Living Arrangements: Spouse/significant other;Children;Other relatives Available Help at Discharge: Family;Available 24 hours/day   Home Access: Stairs to enter   Entrance Stairs-Number of Steps: 2   Home Equipment: Cane - single point      Prior Function Level of Independence: Independent with assistive device(s)         Comments: walked with cane for past 2 weeks     Hand Dominance        Extremity/Trunk Assessment   Upper Extremity Assessment Upper Extremity Assessment: Defer to OT evaluation    Lower Extremity Assessment Lower Extremity Assessment: LLE deficits/detail LLE Deficits / Details: ankle PF/DF AROM decr 75% 2* pain LLE: Unable to fully assess due to pain LLE Sensation: WNL LLE Coordination: WNL    Cervical / Trunk Assessment Cervical / Trunk Assessment: Normal  Communication   Communication: Prefers language other than [place];Interpreter utilized (family interpreted)  Cognition Arousal/Alertness: Awake/alert Behavior During Therapy: WFL for  tasks assessed/performed Overall Cognitive Status: Within Functional Limits for tasks assessed                                        General Comments      Exercises     Assessment/Plan    PT Assessment Patient needs continued PT services  PT Problem List Decreased mobility;Decreased range of motion;Decreased activity  tolerance;Decreased balance;Decreased knowledge of use of DME       PT Treatment Interventions Gait training;Therapeutic activities;Therapeutic exercise;Functional mobility training;Patient/family education    PT Goals (Current goals can be found in the Care Plan section)  Acute Rehab PT Goals Patient Stated Goal: DC home PT Goal Formulation: With family Time For Goal Achievement: 07/20/20 Potential to Achieve Goals: Good    Frequency Min 3X/week   Barriers to discharge        Co-evaluation PT/OT/SLP Co-Evaluation/Treatment: Yes Reason for Co-Treatment: For patient/therapist safety PT goals addressed during session: Mobility/safety with mobility;Proper use of DME         AM-PAC PT "6 Clicks" Mobility  Outcome Measure Help needed turning from your back to your side while in a flat bed without using bedrails?: None Help needed moving from lying on your back to sitting on the side of a flat bed without using bedrails?: A Little Help needed moving to and from a bed to a chair (including a wheelchair)?: A Little Help needed standing up from a chair using your arms (e.g., wheelchair or bedside chair)?: A Little Help needed to walk in hospital room?: A Lot Help needed climbing 3-5 steps with a railing? : Total 6 Click Score: 16    End of Session Equipment Utilized During Treatment: Gait belt Activity Tolerance: Patient limited by pain Patient left: in chair;with call bell/phone within reach;with chair alarm set Nurse Communication: Mobility status PT Visit Diagnosis: Difficulty in walking, not elsewhere classified (R26.2);Pain Pain - Right/Left: Left Pain - part of body: Ankle and joints of foot    Time: 6063-0160 PT Time Calculation (min) (ACUTE ONLY): 27 min   Charges:   PT Evaluation $PT Eval Low Complexity: 1 Low         Ralene Bathe Kistler PT 07/06/2020  Acute Rehabilitation Services Pager [phone] Office [phone]

## 2020-07-06 NOTE — Progress Notes (Signed)
PROGRESS NOTE    Brian Potter  JEH:[id number] DOB: 08/04/1928 DOA: 07/04/2020 PCP: Patient, No Pcp Per    Brief Narrative:  87 y.o. male with past medical history significant for CKD stage IIIa presents to ED with complaint of pain in the right elbow and left lower extremity.  Patient speaks [place].  His granddaughter is at the bedside functioning as Optometrist.  According to her, patient started having right elbow as well as left ankle and lower extremity pain more than a week ago.  He went to see orthopedics about a week ago and was prescribed indomethacin and doxycycline.  Over the course of last 1 week, his right elbow has decreased in size and he has had significant drainage from that but left ankle/foot area has continued to swell and painful and tender to the point that he is unable to walk.  No fever, chills or sweating or any recent travel.  According to granddaughter, patient does not like to go to hospitals or doctors.  He is not on any medications either.  No history of melena or hematochezia.  Assessment & Plan:   Active Problems:   Osteomyelitis of ankle or foot, acute (HCC)    Normocytic anemia/possible acute blood loss anemia secondary to upper GI bleed:  -Patient's last known hemoglobin was 11.8 in December 2020 and now down to 7.6.   -No history of melena but he is positive FOBT in the ED.   -GI consulted. On discussion with pt and family, pt had decided against further invasive work up, but is agreeable to meds and blood tx as needed -Hgb was down to 6.5, post-2 units PRBC's with repeat hgb of 9.8 this AM -Protonix started per GI recs -GI has since signed off  Tophaceous gout of left foot area, osteomyelitis ruled out:  -X-ray of the left foot shows lytic areas and osteomyelitis at multiple areas of the left foot.   -MRI left lower extremity personally reviewed, findings consistent for extensive tophaceous gout involving L foot -Uric acid is w/in normal limits, but can  be normal during acute gout flare -ESR is markedly elevated at >140 -Have started BID colchicine -Still complaining of marked pain this AM -Will give trial of IV solumedrol -PT/OT consulted  DVT prophylaxis: SCD's Code Status: DNR Family Communication: Pt in room, family is currentlyt at bedside  Status is: Inpatient  Remains inpatient appropriate because:Ongoing active pain requiring inpatient pain management and Unsafe d/c plan   Dispo: The patient is from: Home              Anticipated d/c is to: Pending PT eval              Anticipated d/c date is: 2 days              Patient currently is not medically stable to d/c.   Difficult to place patient No       Consultants:   GI  Procedures:     Antimicrobials: Anti-infectives (From admission, onward)   Start     Dose/Rate Route Frequency Ordered Stop   07/04/20 1400  ceFAZolin (ANCEF) IVPB 1 g/50 mL premix        1 g 100 mL/hr over 30 Minutes Intravenous  Once 07/04/20 1355 07/04/20 1725      Subjective: Complaining of LE pain  Objective: Vitals:   07/05/20 2120 07/05/20 2329 07/06/20 0614 07/06/20 1537  BP: (!) 125/53 (!) 129/59 132/62 117/60  Pulse: 88 81 77 77  Resp: '16 17 16 18  '$ Temp:  98.2 F (36.8 C)  98.8 F (37.1 C)  TempSrc:  Axillary  Oral  SpO2: 97% 98% 99% 94%  Weight:        Intake/Output Summary (Last 24 hours) at 07/06/2020 1746 Last data filed at 07/06/2020 1700 Gross per 24 hour  Intake 3221.14 ml  Output 100 ml  Net 3121.14 ml   Filed Weights   07/04/20 1908  Weight: 49.9 kg    Examination: General exam: Awake, laying in bed, in nad Respiratory system: Normal respiratory effort, no wheezing Cardiovascular system: regular rate, s1, s2 Gastrointestinal system: Soft, nondistended, positive BS Central nervous system: CN2-12 grossly intact, strength intact Extremities: Perfused, no clubbing Skin: Normal skin turgor, no notable skin lesions seen Psychiatry: Mood normal // no  visual hallucinations    Data Reviewed: I have personally reviewed following labs and imaging studies  CBC: Recent Labs  Lab 07/04/20 1056 07/05/20 1020 07/06/20 0437  WBC 13.5* 12.1* 11.5*  NEUTROABS 9.9*  --   --   HGB 7.6* 6.5* 9.8*  HCT 23.9* 19.8* 28.7*  MCV 86.0 83.2 85.2  PLT 677* 566* 540*   Basic Metabolic Panel: Recent Labs  Lab 07/04/20 1056 07/04/20 1730 07/05/20 1020 07/06/20 0437  NA 133*  --  133* 133*  K 4.7  --  4.6 4.0  CL 99  --  104 105  CO2 21*  --  22 20*  GLUCOSE 112*  --  90 95  BUN 39*  --  32* 33*  CREATININE 1.28*  --  1.38* 1.19  CALCIUM 8.6*  --  8.0* 7.9*  MG  --  2.1  --   --    GFR: CrCl cannot be calculated (Unknown ideal weight.). Liver Function Tests: Recent Labs  Lab 07/04/20 1056 07/05/20 1020 07/06/20 0437  AST 58* 43* 42*  ALT $Re'16 14 14  'hoV$ ALKPHOS 118 110 95  BILITOT 0.6 0.6 0.9  PROT 7.8 6.9 6.9  ALBUMIN 2.2* 2.1* 2.0*   No results for input(s): LIPASE, AMYLASE in the last 168 hours. No results for input(s): AMMONIA in the last 168 hours. Coagulation Profile: No results for input(s): INR, PROTIME in the last 168 hours. Cardiac Enzymes: No results for input(s): CKTOTAL, CKMB, CKMBINDEX, TROPONINI in the last 168 hours. BNP (last 3 results) No results for input(s): PROBNP in the last 8760 hours. HbA1C: No results for input(s): HGBA1C in the last 72 hours. CBG: No results for input(s): GLUCAP in the last 168 hours. Lipid Profile: No results for input(s): CHOL, HDL, LDLCALC, TRIG, CHOLHDL, LDLDIRECT in the last 72 hours. Thyroid Function Tests: Recent Labs    07/04/20 1730  TSH 2.798   Anemia Panel: Recent Labs    07/04/20 1730  VITAMINB12 596  FOLATE 11.8  FERRITIN 146  TIBC 183*  IRON 11*  RETICCTPCT 2.2   Sepsis Labs: Recent Labs  Lab 07/04/20 1053 07/04/20 1310  LATICACIDVEN 1.5 1.2    Recent Results (from the past 240 hour(s))  Blood culture (routine x 2)     Status: None (Preliminary  result)   Collection Time: 07/04/20  5:26 PM   Specimen: BLOOD  Result Value Ref Range Status   Specimen Description   Final    BLOOD LEFT ANTECUBITAL Performed at [facility], [place] [address] [place], [place] [address]    Special Requests   Final    BOTTLES DRAWN AEROBIC AND ANAEROBIC Blood Culture adequate volume Performed at [facility]  [place] [address] [place], [place] [address]    Culture   Final    NO GROWTH 2 DAYS Performed at [place] [address], [place], [place] [address]    Report Status PENDING  Incomplete  SARS CORONAVIRUS 2 (TAT 6-24 HRS) Nasopharyngeal Nasopharyngeal Swab     Status: None   Collection Time: 07/04/20  5:30 PM   Specimen: Nasopharyngeal Swab  Result Value Ref Range Status   SARS Coronavirus 2 NEGATIVE NEGATIVE Final    Comment: (NOTE) SARS-CoV-2 target nucleic acids are NOT DETECTED.  The SARS-CoV-2 RNA is generally detectable in upper and lower respiratory specimens during the acute phase of infection. Negative results do not preclude SARS-CoV-2 infection, do not rule out co-infections with other pathogens, and should not be used as the sole basis for treatment or other patient management decisions. Negative results must be combined with clinical observations, patient history, and epidemiological information. The expected result is Negative.  Fact Sheet for Patients: SugarRoll.be  Fact Sheet for Healthcare Providers: https://www.woods-mathews.com/  This test is not yet approved or cleared by the [place] FDA and  has been authorized for detection and/or diagnosis of SARS-CoV-2 by FDA under an Emergency Use Authorization (EUA). This EUA will remain  in effect (meaning this test can be used) for the duration of the COVID-19 declaration under Se ction 564(b)(1) of the Act, 21 U.S.C. section 360bbb-3(b)(1), unless the authorization is terminated  or revoked sooner.  Performed at [facility], [place] [address], [place], [place] [address]      Radiology Studies: DG ELBOW COMPLETE RIGHT (3+VIEW)  Result Date: 07/04/2020 CLINICAL DATA:  Right elbow wound. EXAM: RIGHT ELBOW - COMPLETE 3+ VIEW COMPARISON:  None. FINDINGS: Chronic distracted fracture of the olecranon with deformity and hypertrophy of the coronoid process. Chronic radial head deformity with posterior dislocation. No acute fracture. No joint effusion. Posterior elbow soft tissue wound and few foci of subcutaneous emphysema. No definite bony destruction or periosteal reaction. IMPRESSION: 1. Posterior elbow soft tissue wound and few foci of subcutaneous emphysema. No radiographic evidence of osteomyelitis. 2. 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 Electronically Signed   By: Titus Dubin M.D.   On: 07/04/2020 20:02   MR FOOT LEFT WO CONTRAST  Result Date: 07/04/2020 CLINICAL DATA:  Chronic left foot swelling, acutely worsened over the past week. EXAM: MRI OF THE LEFT FOOT WITHOUT CONTRAST TECHNIQUE: Multiplanar, multisequence MR imaging of the left forefoot was performed. No intravenous contrast was administered. COMPARISON:  Left foot x-rays from same day. FINDINGS: Bones/Joint/Cartilage There are multiple low T2 and T1 signal soft tissue masses with associated bony erosion centered on the first, third, and fourth TMT joints, first and fifth MTP joints, first IP joint, and second and third PIP joints. No fracture or dislocation. No joint effusion. Muscles and Tendons Flexor and extensor tendons are intact. Increased T2 signal within the intrinsic muscles of the forefoot, nonspecific, but likely related to diabetic muscle changes. Soft tissue Dorsal forefoot soft tissue swelling. No fluid collection or hematoma. IMPRESSION: 1. 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 No evidence of osteomyelitis. Electronically Signed   By: Titus Dubin M.D.   On: 07/04/2020 20:09    Scheduled Meds: . colchicine  0.6 mg Oral BID  . feeding supplement  237 mL Oral BID BM  . multivitamin with minerals  1 tablet Oral Daily  . pantoprazole  40 mg Oral BID  Or  . pantoprazole (PROTONIX) IV  40 mg Intravenous BID   Continuous Infusions: . sodium chloride 75 mL/hr at 07/06/20 0912     LOS: 2 days   Marylu Lund, MD [facility] Pager On Amion  If 7PM-7AM, please contact night-coverage 07/06/2020, 5:46 PM

## 2020-07-07 DIAGNOSIS — M109 Gout, unspecified: Secondary | ICD-10-CM

## 2020-07-07 DIAGNOSIS — Z66 Do not resuscitate: Secondary | ICD-10-CM

## 2020-07-07 DIAGNOSIS — D62 Acute posthemorrhagic anemia: Secondary | ICD-10-CM

## 2020-07-07 DIAGNOSIS — D649 Anemia, unspecified: Secondary | ICD-10-CM

## 2020-07-07 DIAGNOSIS — N179 Acute kidney failure, unspecified: Secondary | ICD-10-CM

## 2020-07-07 DIAGNOSIS — M1A9XX1 Chronic gout, unspecified, with tophus (tophi): Secondary | ICD-10-CM

## 2020-07-07 LAB — CBC
HCT: 30.4 % — ABNORMAL LOW (ref 39.0–52.0)
Hemoglobin: 10 g/dL — ABNORMAL LOW (ref 13.0–17.0)
MCH: 28.6 pg (ref 26.0–34.0)
MCHC: 32.9 g/dL (ref 30.0–36.0)
MCV: 86.9 fL (ref 80.0–100.0)
Platelets: 494 10*3/uL — ABNORMAL HIGH (ref 150–400)
RBC: 3.5 MIL/uL — ABNORMAL LOW (ref 4.22–5.81)
RDW: 14.6 % (ref 11.5–15.5)
WBC: 11.4 10*3/uL — ABNORMAL HIGH (ref 4.0–10.5)
nRBC: 0 % (ref 0.0–0.2)

## 2020-07-07 LAB — COMPREHENSIVE METABOLIC PANEL
ALT: 15 U/L (ref 0–44)
AST: 48 U/L — ABNORMAL HIGH (ref 15–41)
Albumin: 2 g/dL — ABNORMAL LOW (ref 3.5–5.0)
Alkaline Phosphatase: 93 U/L (ref 38–126)
Anion gap: 8 (ref 5–15)
BUN: 27 mg/dL — ABNORMAL HIGH (ref 8–23)
CO2: 19 mmol/L — ABNORMAL LOW (ref 22–32)
Calcium: 7.6 mg/dL — ABNORMAL LOW (ref 8.9–10.3)
Chloride: 108 mmol/L (ref 98–111)
Creatinine, Ser: 1.07 mg/dL (ref 0.61–1.24)
GFR, Estimated: >60 mL/min (ref >60)
Glucose, Bld: 121 mg/dL — ABNORMAL HIGH (ref 70–99)
Potassium: 4.2 mmol/L (ref 3.5–5.1)
Sodium: 135 mmol/L (ref 135–145)
Total Bilirubin: 0.4 mg/dL (ref 0.3–1.2)
Total Protein: 7.4 g/dL (ref 6.5–8.1)

## 2020-07-07 MED ORDER — PREDNISONE 10 MG PO TABS: ORAL_TABLET | ORAL | 0 refills | Status: DC

## 2020-07-07 MED ORDER — COLCHICINE 0.6 MG PO TABS: 0.6000 mg | ORAL_TABLET | Freq: Two times a day (BID) | ORAL | 0 refills | Status: AC

## 2020-07-07 MED ORDER — PANTOPRAZOLE SODIUM 40 MG PO TBEC: 40.0000 mg | DELAYED_RELEASE_TABLET | Freq: Two times a day (BID) | ORAL | 0 refills | Status: AC

## 2020-07-07 MED ORDER — ALLOPURINOL 100 MG PO TABS: 100.0000 mg | ORAL_TABLET | Freq: Every day | ORAL | 0 refills | Status: AC

## 2020-07-07 NOTE — Progress Notes (Signed)
RN went over patients discharge instructions with patients granddaughter Hthery and all questions were answered.

## 2020-07-07 NOTE — Discharge Summary (Addendum)
Physician Discharge Summary  Brian Potter JQZ:[id number] DOB: 1928/07/18 DOA: 07/04/2020  PCP: Patient, No Pcp Per  Admit date: 07/04/2020 Discharge date: 07/07/2020  Admitted From: Home Disposition:  Home  Recommendations for Outpatient Follow-up:  1. Follow up with PCP in 1-2 weeks  Home Health:PT  Equipment/Devices:  wheelchair  Discharge Condition:Improved CODE STATUS:DNR Diet recommendation: Regular   Brief/Interim Summary: 87 y.o.malewithpast medical history significant for CKD stage IIIa presents to ED with complaint of pain in the right elbow and left lower extremity. Patient speaks [place].His granddaughter is at the bedside functioning as Optometrist. According to her, patient started having right elbow as well as left ankle and lower extremity pain more than a week ago. He went to see orthopedics about a week ago and was prescribed indomethacin and doxycycline. Over the course of last 1 week, his right elbow has decreased in size and he has had significant drainage from that but left ankle/foot area has continued to swell and painful and tender to the point that he is unable to walk. No fever, chills or sweating or any recent travel. According to granddaughter, patient does not like to go to hospitals or doctors. He is not on any medications either. No history of melena or hematochezia.  Discharge Diagnoses:  Principal Problem:   Acute blood loss anemia Active Problems:   Chronic tophaceous gout of left foot   Gout of right elbow   ARF (acute renal failure) (HCC)   DNR (do not resuscitate)  Normocytic anemia/possible acute blood loss anemia secondary to upper GI bleed:  -Patient's last known hemoglobin was 11.8 in December 2020 and now down to 7.6.  -No history of melena but he is positive FOBT in the ED.  -GI consulted. On discussion with pt and family, pt had decided against further invasive work up, but is agreeable to meds and blood tx as needed -Hgb was  down to 6.5, post-2 units PRBC's with repeat hgb of over 10 this AM -Protonix BID x 2 months then once daily afterwards per GI recs -GI has since signed off  Tophaceous gout of left foot area, osteomyelitis ruled out:  -X-ray of the left foot shows lytic areas and osteomyelitis at multiple areas of the left foot.  -MRI left lower extremity personally reviewed, findings consistent for extensive tophaceous gout involving L foot -Uric acid is w/in normal limits, but can be normal during acute gout flare -ESR is markedly elevated at >140 -Have started BID colchicine -Given trial of IV solumedrol with very good results, have prescribed prednisone taper with allopurinol on d/c -PT/OT recs for HHPT  Discharge Instructions   Allergies as of 07/07/2020   No Known Allergies     Medication List    STOP taking these medications   doxycycline 100 MG capsule Commonly known as: VIBRAMYCIN   indomethacin 50 MG capsule Commonly known as: INDOCIN     TAKE these medications   allopurinol 100 MG tablet Commonly known as: Zyloprim Take 1 tablet (100 mg total) by mouth daily.   colchicine 0.6 MG tablet Take 1 tablet (0.6 mg total) by mouth 2 (two) times daily.   pantoprazole 40 MG tablet Commonly known as: PROTONIX Take 1 tablet (40 mg total) by mouth 2 (two) times daily.   predniSONE 10 MG tablet Commonly known as: DELTASONE Take 6 tablets (60 mg total) by mouth daily with breakfast for 2 days, THEN 4 tablets (40 mg total) daily with breakfast for 2 days, THEN 2 tablets (20 mg total)  daily with breakfast for 2 days, THEN 1 tablet (10 mg total) daily with breakfast for 2 days, THEN 0.5 tablets (5 mg total) daily with breakfast for 2 days. Start taking on: July 07, 2020            Durable Medical Equipment  (From admission, onward)         Start     Ordered   07/06/20 1601  For home use only DME standard manual wheelchair with seat cushion  Once       Comments: Patient  suffers from gout which impairs their ability to perform daily activities like dressing in the home.  A cane will not resolve issue with performing activities of daily living. A wheelchair will allow patient to safely perform daily activities. Patient can safely propel the wheelchair in the home or has a caregiver who can provide assistance. Length of need Lifetime. Accessories: elevating leg rests (ELRs), wheel locks, extensions and anti-tippers.   07/06/20 1601          Follow-up Information    Follow up with your PCP in 1-2 weeks. Schedule an appointment as soon as possible for a visit.        [facility] Follow up.   Why: to provide home health physical therapy Contact information: [phone]             No Known Allergies  Consultations:  GI  Procedures/Studies: DG ELBOW COMPLETE RIGHT (3+VIEW)  Result Date: 07/04/2020 CLINICAL DATA:  Right elbow wound. EXAM: RIGHT ELBOW - COMPLETE 3+ VIEW COMPARISON:  None. FINDINGS: Chronic distracted fracture of the olecranon with deformity and hypertrophy of the coronoid process. Chronic radial head deformity with posterior dislocation. No acute fracture. No joint effusion. Posterior elbow soft tissue wound and few foci of subcutaneous emphysema. No definite bony destruction or periosteal reaction. IMPRESSION: 1. Posterior elbow soft tissue wound and few foci of subcutaneous emphysema. No radiographic evidence of osteomyelitis. 2. 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 Electronically Signed   By: Titus Dubin M.D.   On: 07/04/2020 20:02   MR FOOT LEFT WO CONTRAST  Result Date: 07/04/2020 CLINICAL DATA:  Chronic left foot swelling, acutely worsened over the past week. EXAM: MRI OF THE LEFT FOOT WITHOUT CONTRAST TECHNIQUE: Multiplanar, multisequence MR imaging of the left forefoot was performed. No intravenous contrast was administered. COMPARISON:  Left foot x-rays from same day. FINDINGS:  Bones/Joint/Cartilage There are multiple low T2 and T1 signal soft tissue masses with associated bony erosion centered on the first, third, and fourth TMT joints, first and fifth MTP joints, first IP joint, and second and third PIP joints. No fracture or dislocation. No joint effusion. Muscles and Tendons Flexor and extensor tendons are intact. Increased T2 signal within the intrinsic muscles of the forefoot, nonspecific, but likely related to diabetic muscle changes. Soft tissue Dorsal forefoot soft tissue swelling. No fluid collection or hematoma. IMPRESSION: 1. 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 No evidence of osteomyelitis. Electronically Signed   By: Titus Dubin M.D.   On: 07/04/2020 20:09   DG Foot Complete Left  Result Date: 07/04/2020 CLINICAL DATA:  Left foot infection. EXAM: LEFT FOOT - COMPLETE 3+ VIEW COMPARISON:  None. FINDINGS: There is noted lytic destruction in multiple areas, including the distal portions of the first and fifth metatarsals as well as most of the fifth proximal phalanx. Rounded lucencies or lytic areas are also noted in the first proximal phalanx. There is  soft tissue swelling around the first and fifth metatarsophalangeal joints these findings may simply represent gout, but osteomyelitis cannot be excluded. Lytic lesion is also seen involving the distal portion of the third proximal phalanx. Dorsal soft tissue swelling is noted concerning for infection. Degenerative changes are seen involving the talocalcaneal joint. IMPRESSION: Lytic destruction is noted in multiple areas as described above. Soft tissue swelling is noted around the first and fifth metatarsophalangeal joints. Lytic lesion is also seen involving the distal portion of the third proximal phalanx. These findings may represent gout, but osteomyelitis must be considered. Further evaluation with MRI is recommended. Electronically Signed   By: Marijo Conception M.D.   On: 07/04/2020 11:18     Subjective: Eager to go home today. Reports feeling better  Discharge Exam: Vitals:   07/06/20 2030 07/07/20 0640  BP: 140/65 (!) 152/67  Pulse: 73 79  Resp: 17 16  Temp: 98.5 F (36.9 C) (!) 97.5 F (36.4 C)  SpO2: 96% 97%   Vitals:   07/06/20 0614 07/06/20 1537 07/06/20 2030 07/07/20 0640  BP: 132/62 117/60 140/65 (!) 152/67  Pulse: 77 77 73 79  Resp: _0 Temp:  98.8 F (37.1 C) 98.5 F (36.9 C) (!) 97.5 F (36.4 C)  TempSrc:  Oral    SpO2: 99% 94% 96% 97%  Weight:        General: Pt is alert, awake, not in acute distress Cardiovascular: RRR, S1/S2 +, no rubs, no gallops Respiratory: CTA bilaterally, no wheezing, no rhonchi Abdominal: Soft, NT, ND, bowel sounds + Extremities: no edema, no cyanosis   The results of significant diagnostics from this hospitalization (including imaging, microbiology, ancillary and laboratory) are listed below for reference.     Microbiology: Recent Results (from the past 240 hour(s))  Blood culture (routine x 2)     Status: None (Preliminary result)   Collection Time: 07/04/20  5:26 PM   Specimen: BLOOD  Result Value Ref Range Status   Specimen Description   Final    BLOOD LEFT ANTECUBITAL Performed at [facility] [address] [place], [place] [address]    Special Requests   Final    BOTTLES DRAWN AEROBIC AND ANAEROBIC Blood Culture adequate volume Performed at [facility] [address], [place], [place] [address]    Culture   Final    NO GROWTH 3 DAYS Performed at [facility], [place] [address] [place], [place] [address]    Report Status PENDING  Incomplete  SARS CORONAVIRUS 2 (TAT 6-24 HRS) Nasopharyngeal Nasopharyngeal Swab     Status: None   Collection Time: 07/04/20  5:30 PM   Specimen: Nasopharyngeal Swab  Result Value Ref Range Status   SARS Coronavirus 2 NEGATIVE NEGATIVE Final    Comment: (NOTE) SARS-CoV-2 target nucleic acids are NOT  DETECTED.  The SARS-CoV-2 RNA is generally detectable in upper and lower respiratory specimens during the acute phase of infection. Negative results do not preclude SARS-CoV-2 infection, do not rule out co-infections with other pathogens, and should not be used as the sole basis for treatment or other patient management decisions. Negative results must be combined with clinical observations, patient history, and epidemiological information. The expected result is Negative.  Fact Sheet for Patients: SugarRoll.be  Fact Sheet for Healthcare Providers: https://www.woods-mathews.com/  This test is not yet approved or cleared by the [place] FDA and  has been authorized for detection and/or diagnosis of SARS-CoV-2 by FDA under  an Emergency Use Authorization (EUA). This EUA will remain  in effect (meaning this test can be used) for the duration of the COVID-19 declaration under Se ction 564(b)(1) of the Act, 21 U.S.C. section 360bbb-3(b)(1), unless the authorization is terminated or revoked sooner.  Performed at [facility], [place] [address], [place], [place] [address]      Labs: BNP (last 3 results) No results for input(s): BNP in the last 8760 hours. Basic Metabolic Panel: Recent Labs  Lab 07/04/20 1056 07/04/20 1730 07/05/20 1020 07/06/20 0437 07/07/20 0430  NA 133*  --  133* 133* 135  K 4.7  --  4.6 4.0 4.2  CL 99  --  104 105 108  CO2 21*  --  22 20* 19*  GLUCOSE 112*  --  90 95 121*  BUN 39*  --  32* 33* 27*  CREATININE 1.28*  --  1.38* 1.19 1.07  CALCIUM 8.6*  --  8.0* 7.9* 7.6*  MG  --  2.1  --   --   --    Liver Function Tests: Recent Labs  Lab 07/04/20 1056 07/05/20 1020 07/06/20 0437 07/07/20 0430  AST 58* 43* 42* 48*  ALT _0 ALKPHOS 118 110 95 93  BILITOT 0.6 0.6 0.9 0.4  PROT 7.8 6.9 6.9 7.4  ALBUMIN 2.2* 2.1* 2.0* 2.0*   No results for input(s): LIPASE, AMYLASE in the last 168  hours. No results for input(s): AMMONIA in the last 168 hours. CBC: Recent Labs  Lab 07/04/20 1056 07/05/20 1020 07/06/20 0437 07/07/20 0430  WBC 13.5* 12.1* 11.5* 11.4*  NEUTROABS 9.9*  --   --   --   HGB 7.6* 6.5* 9.8* 10.0*  HCT 23.9* 19.8* 28.7* 30.4*  MCV 86.0 83.2 85.2 86.9  PLT 677* 566* 496* 494*   Cardiac Enzymes: No results for input(s): CKTOTAL, CKMB, CKMBINDEX, TROPONINI in the last 168 hours. BNP: Invalid input(s): POCBNP CBG: No results for input(s): GLUCAP in the last 168 hours. D-Dimer No results for input(s): DDIMER in the last 72 hours. Hgb A1c No results for input(s): HGBA1C in the last 72 hours. Lipid Profile No results for input(s): CHOL, HDL, LDLCALC, TRIG, CHOLHDL, LDLDIRECT in the last 72 hours. Thyroid function studies Recent Labs    07/04/20 1730  TSH 2.798   Anemia work up Recent Labs    07/04/20 1730  VITAMINB12 596  FOLATE 11.8  FERRITIN 146  TIBC 183*  IRON 11*  RETICCTPCT 2.2   Urinalysis No results found for: COLORURINE, APPEARANCEUR, LABSPEC, [place], GLUCOSEU, [place], BILIRUBINUR, KETONESUR, PROTEINUR, UROBILINOGEN, NITRITE, LEUKOCYTESUR Sepsis Labs Invalid input(s): PROCALCITONIN,  WBC,  LACTICIDVEN Microbiology Recent Results (from the past 240 hour(s))  Blood culture (routine x 2)     Status: None (Preliminary result)   Collection Time: 07/04/20  5:26 PM   Specimen: BLOOD  Result Value Ref Range Status   Specimen Description   Final    BLOOD LEFT ANTECUBITAL Performed at [facility] [address] [place], [place] [address]    Special Requests   Final    BOTTLES DRAWN AEROBIC AND ANAEROBIC Blood Culture adequate volume Performed at [facility] [address], [place], [place] [address]    Culture   Final    NO GROWTH 3 DAYS Performed at [facility], [place] [address] [place], [place] [address]    Report Status PENDING  Incomplete  SARS CORONAVIRUS 2 (TAT 6-24 HRS)  Nasopharyngeal  Nasopharyngeal Swab     Status: None   Collection Time: 07/04/20  5:30 PM   Specimen: Nasopharyngeal Swab  Result Value Ref Range Status   SARS Coronavirus 2 NEGATIVE NEGATIVE Final    Comment: (NOTE) SARS-CoV-2 target nucleic acids are NOT DETECTED.  The SARS-CoV-2 RNA is generally detectable in upper and lower respiratory specimens during the acute phase of infection. Negative results do not preclude SARS-CoV-2 infection, do not rule out co-infections with other pathogens, and should not be used as the sole basis for treatment or other patient management decisions. Negative results must be combined with clinical observations, patient history, and epidemiological information. The expected result is Negative.  Fact Sheet for Patients: SugarRoll.be  Fact Sheet for Healthcare Providers: https://www.woods-mathews.com/  This test is not yet approved or cleared by the [place] FDA and  has been authorized for detection and/or diagnosis of SARS-CoV-2 by FDA under an Emergency Use Authorization (EUA). This EUA will remain  in effect (meaning this test can be used) for the duration of the COVID-19 declaration under Se ction 564(b)(1) of the Act, 21 U.S.C. section 360bbb-3(b)(1), unless the authorization is terminated or revoked sooner.  Performed at [facility], [place] [address], [place], [place] [address]    Time spent: 30 min  SIGNED:   Marylu Lund, MD  [facility] 07/07/2020, 5:10 PM  If 7PM-7AM, please contact night-coverage

## 2020-07-07 NOTE — TOC Transition Note (Signed)
Transition of Care [facility]) - CM/SW Discharge Note   Patient Details  Name: Brian Potter MRN: [id number] Date of Birth: 1929-01-03  Transition of Care [facility]) CM/SW Contact:  Amada Jupiter, LCSW Phone Number: 07/07/2020, 11:43 AM   Clinical Narrative:    Have spoken with pt's granddaughter, Raymond Gurney [phone]) to review dc referrals.  She is aware that pt has been medically cleared for dc home today.  Discussed HHPT and wheelchair orders completed.  Also, discussed need for pt to establish himself with a PCP.  Provided pt/family with a list of providers within short distance from their home and instructed granddaughter to please assist pt in making a new patient appointment as soon as possible - she is agreed.  Granddaughter has no further questions.  No further TOC needs.   Final next level of care: Home w Home Health Services Barriers to Discharge: Barriers Resolved   Patient Goals and CMS Choice Patient states their goals for this hospitalization and ongoing recovery are:: go home CMS Medicare.gov Compare Post Acute Care list provided to:: Patient Choice offered to / list presented to : Spouse  Discharge Placement                       Discharge Plan and Services                DME Arranged: Wheelchair manual DME Agency: [employer] Date DME Agency Contacted: 07/06/20 Time DME Agency Contacted: 1600 Representative spoke with at DME Agency: Velna Hatchet HH Arranged: PT HH Agency: [facility] Date HH Agency Contacted: 07/07/20 Time HH Agency Contacted: 0900 Representative spoke with at [facility] Agency: Pearson Grippe  Social Determinants of Health (SDOH) Interventions     Readmission Risk Interventions No flowsheet data found.

## 2020-07-09 DIAGNOSIS — M1A09X1 Idiopathic chronic gout, multiple sites, with tophus (tophi): Secondary | ICD-10-CM | POA: Diagnosis not present

## 2020-07-09 LAB — CULTURE, BLOOD (ROUTINE X 2)
Culture: NO GROWTH
Special Requests: ADEQUATE

## 2020-07-11 DIAGNOSIS — M25521 Pain in right elbow: Secondary | ICD-10-CM | POA: Diagnosis not present

## 2020-07-11 DIAGNOSIS — M1A9XX1 Chronic gout, unspecified, with tophus (tophi): Secondary | ICD-10-CM | POA: Diagnosis not present

## 2020-07-15 ENCOUNTER — Encounter (HOSPITAL_COMMUNITY): Payer: Self-pay | Admitting: Internal Medicine

## 2020-07-15 ENCOUNTER — Emergency Department (HOSPITAL_COMMUNITY): Payer: Medicare Other

## 2020-07-15 ENCOUNTER — Other Ambulatory Visit: Payer: Self-pay

## 2020-07-15 ENCOUNTER — Inpatient Hospital Stay (HOSPITAL_COMMUNITY)
Admission: EM | Admit: 2020-07-15 | Discharge: 2020-07-21 | DRG: 554 | Disposition: A | Discharge status: Home Health | Payer: Medicare Other | Attending: Internal Medicine | Admitting: Internal Medicine

## 2020-07-15 DIAGNOSIS — Z8719 Personal history of other diseases of the digestive system: Secondary | ICD-10-CM

## 2020-07-15 DIAGNOSIS — M1A9XX1 Chronic gout, unspecified, with tophus (tophi): Secondary | ICD-10-CM | POA: Diagnosis not present

## 2020-07-15 DIAGNOSIS — Z20822 Contact with and (suspected) exposure to covid-19: Secondary | ICD-10-CM | POA: Diagnosis present

## 2020-07-15 DIAGNOSIS — I517 Cardiomegaly: Secondary | ICD-10-CM | POA: Diagnosis not present

## 2020-07-15 DIAGNOSIS — Z66 Do not resuscitate: Secondary | ICD-10-CM | POA: Diagnosis present

## 2020-07-15 DIAGNOSIS — I480 Paroxysmal atrial fibrillation: Secondary | ICD-10-CM

## 2020-07-15 DIAGNOSIS — R531 Weakness: Secondary | ICD-10-CM | POA: Diagnosis not present

## 2020-07-15 DIAGNOSIS — I495 Sick sinus syndrome: Secondary | ICD-10-CM

## 2020-07-15 DIAGNOSIS — R197 Diarrhea, unspecified: Secondary | ICD-10-CM | POA: Diagnosis present

## 2020-07-15 DIAGNOSIS — E1122 Type 2 diabetes mellitus with diabetic chronic kidney disease: Secondary | ICD-10-CM | POA: Diagnosis present

## 2020-07-15 DIAGNOSIS — I491 Atrial premature depolarization: Secondary | ICD-10-CM | POA: Diagnosis not present

## 2020-07-15 DIAGNOSIS — L02612 Cutaneous abscess of left foot: Secondary | ICD-10-CM | POA: Diagnosis not present

## 2020-07-15 DIAGNOSIS — S93412A Sprain of calcaneofibular ligament of left ankle, initial encounter: Secondary | ICD-10-CM | POA: Diagnosis not present

## 2020-07-15 DIAGNOSIS — S91002A Unspecified open wound, left ankle, initial encounter: Secondary | ICD-10-CM | POA: Diagnosis not present

## 2020-07-15 DIAGNOSIS — M25521 Pain in right elbow: Secondary | ICD-10-CM | POA: Diagnosis present

## 2020-07-15 DIAGNOSIS — A419 Sepsis, unspecified organism: Secondary | ICD-10-CM | POA: Diagnosis not present

## 2020-07-15 DIAGNOSIS — L089 Local infection of the skin and subcutaneous tissue, unspecified: Secondary | ICD-10-CM | POA: Diagnosis present

## 2020-07-15 DIAGNOSIS — S51001A Unspecified open wound of right elbow, initial encounter: Secondary | ICD-10-CM | POA: Diagnosis not present

## 2020-07-15 DIAGNOSIS — W19XXXA Unspecified fall, initial encounter: Secondary | ICD-10-CM | POA: Diagnosis present

## 2020-07-15 DIAGNOSIS — I10 Essential (primary) hypertension: Secondary | ICD-10-CM | POA: Diagnosis not present

## 2020-07-15 DIAGNOSIS — I499 Cardiac arrhythmia, unspecified: Secondary | ICD-10-CM | POA: Diagnosis not present

## 2020-07-15 DIAGNOSIS — M25572 Pain in left ankle and joints of left foot: Secondary | ICD-10-CM | POA: Diagnosis not present

## 2020-07-15 DIAGNOSIS — R6510 Systemic inflammatory response syndrome (SIRS) of non-infectious origin without acute organ dysfunction: Secondary | ICD-10-CM | POA: Diagnosis not present

## 2020-07-15 DIAGNOSIS — R609 Edema, unspecified: Secondary | ICD-10-CM | POA: Diagnosis not present

## 2020-07-15 DIAGNOSIS — R0902 Hypoxemia: Secondary | ICD-10-CM | POA: Diagnosis not present

## 2020-07-15 DIAGNOSIS — M25472 Effusion, left ankle: Secondary | ICD-10-CM | POA: Diagnosis present

## 2020-07-15 DIAGNOSIS — Z603 Acculturation difficulty: Secondary | ICD-10-CM | POA: Diagnosis present

## 2020-07-15 DIAGNOSIS — M7989 Other specified soft tissue disorders: Secondary | ICD-10-CM | POA: Diagnosis not present

## 2020-07-15 DIAGNOSIS — Z87891 Personal history of nicotine dependence: Secondary | ICD-10-CM

## 2020-07-15 DIAGNOSIS — N1831 Chronic kidney disease, stage 3a: Secondary | ICD-10-CM | POA: Diagnosis present

## 2020-07-15 DIAGNOSIS — I129 Hypertensive chronic kidney disease with stage 1 through stage 4 chronic kidney disease, or unspecified chronic kidney disease: Secondary | ICD-10-CM | POA: Diagnosis present

## 2020-07-15 DIAGNOSIS — R000 Tachycardia, unspecified: Secondary | ICD-10-CM | POA: Diagnosis not present

## 2020-07-15 LAB — CBC WITH DIFFERENTIAL/PLATELET
Abs Immature Granulocytes: 0.08 10*3/uL — ABNORMAL HIGH (ref 0.00–0.07)
Basophils Absolute: 0 10*3/uL (ref 0.0–0.1)
Basophils Relative: 0 %
Eosinophils Absolute: 0 10*3/uL (ref 0.0–0.5)
Eosinophils Relative: 0 %
HCT: 40.5 % (ref 39.0–52.0)
Hemoglobin: 12.9 g/dL — ABNORMAL LOW (ref 13.0–17.0)
Immature Granulocytes: 1 %
Lymphocytes Relative: 11 %
Lymphs Abs: 1.5 10*3/uL (ref 0.7–4.0)
MCH: 28.4 pg (ref 26.0–34.0)
MCHC: 31.9 g/dL (ref 30.0–36.0)
MCV: 89.2 fL (ref 80.0–100.0)
Monocytes Absolute: 1.7 10*3/uL — ABNORMAL HIGH (ref 0.1–1.0)
Monocytes Relative: 12 %
Neutro Abs: 11.3 10*3/uL — ABNORMAL HIGH (ref 1.7–7.7)
Neutrophils Relative %: 76 %
Platelets: 394 10*3/uL (ref 150–400)
RBC: 4.54 MIL/uL (ref 4.22–5.81)
RDW: 15.3 % (ref 11.5–15.5)
WBC: 14.7 10*3/uL — ABNORMAL HIGH (ref 4.0–10.5)
nRBC: 0 % (ref 0.0–0.2)

## 2020-07-15 LAB — LACTIC ACID, PLASMA
Lactic Acid, Venous: 2.1 mmol/L (ref 0.5–1.9)
Lactic Acid, Venous: 1.3 mmol/L (ref 0.5–1.9)

## 2020-07-15 LAB — COMPREHENSIVE METABOLIC PANEL
ALT: 24 U/L (ref 0–44)
AST: 50 U/L — ABNORMAL HIGH (ref 15–41)
Albumin: 2.8 g/dL — ABNORMAL LOW (ref 3.5–5.0)
Alkaline Phosphatase: 81 U/L (ref 38–126)
Anion gap: 9 (ref 5–15)
BUN: 29 mg/dL — ABNORMAL HIGH (ref 8–23)
CO2: 23 mmol/L (ref 22–32)
Calcium: 8.4 mg/dL — ABNORMAL LOW (ref 8.9–10.3)
Chloride: 103 mmol/L (ref 98–111)
Creatinine, Ser: 1.18 mg/dL (ref 0.61–1.24)
GFR, Estimated: 59 mL/min — ABNORMAL LOW (ref >60)
Glucose, Bld: 104 mg/dL — ABNORMAL HIGH (ref 70–99)
Potassium: 4.1 mmol/L (ref 3.5–5.1)
Sodium: 135 mmol/L (ref 135–145)
Total Bilirubin: 0.5 mg/dL (ref 0.3–1.2)
Total Protein: 7.4 g/dL (ref 6.5–8.1)

## 2020-07-15 LAB — MRSA PCR SCREENING: MRSA by PCR: NEGATIVE

## 2020-07-15 LAB — SEDIMENTATION RATE: Sed Rate: 41 mm/hr — ABNORMAL HIGH (ref 0–16)

## 2020-07-15 LAB — C-REACTIVE PROTEIN: CRP: 7.7 mg/dL — ABNORMAL HIGH (ref <1.0)

## 2020-07-15 LAB — PREALBUMIN: Prealbumin: 23.3 mg/dL (ref 18–38)

## 2020-07-15 MED ORDER — VANCOMYCIN HCL 500 MG/100ML IV SOLN
500.0000 mg | INTRAVENOUS | Status: DC
  Administered 2020-07-16 – 2020-07-20 (×5): 500 mg via INTRAVENOUS
  Filled 2020-07-15 (×5): qty 100

## 2020-07-15 MED ORDER — VANCOMYCIN HCL IN DEXTROSE 1-5 GM/200ML-% IV SOLN
1000.0000 mg | Freq: Once | INTRAVENOUS | Status: AC
  Administered 2020-07-15: 1000 mg via INTRAVENOUS
  Filled 2020-07-15: qty 200

## 2020-07-15 MED ORDER — SODIUM CHLORIDE 0.9 % IV SOLN
2.0000 g | INTRAVENOUS | Status: DC
  Administered 2020-07-16 – 2020-07-19 (×4): 2 g via INTRAVENOUS
  Filled 2020-07-15: qty 20
  Filled 2020-07-15 (×3): qty 2

## 2020-07-15 MED ORDER — ONDANSETRON HCL 4 MG PO TABS: 4.0000 mg | ORAL_TABLET | Freq: Four times a day (QID) | ORAL | Status: DC | PRN

## 2020-07-15 MED ORDER — MORPHINE SULFATE (PF) 2 MG/ML IV SOLN
2.0000 mg | INTRAVENOUS | Status: DC | PRN
  Administered 2020-07-15 – 2020-07-16 (×3): 2 mg via INTRAVENOUS
  Filled 2020-07-15 (×3): qty 1

## 2020-07-15 MED ORDER — SODIUM CHLORIDE 0.9 % IV SOLN
2.0000 g | Freq: Once | INTRAVENOUS | Status: AC
  Administered 2020-07-15: 2 g via INTRAVENOUS
  Filled 2020-07-15: qty 2

## 2020-07-15 MED ORDER — METRONIDAZOLE 500 MG PO TABS
500.0000 mg | ORAL_TABLET | Freq: Three times a day (TID) | ORAL | Status: DC
  Administered 2020-07-15 – 2020-07-17 (×5): 500 mg via ORAL
  Filled 2020-07-15 (×5): qty 1

## 2020-07-15 MED ORDER — ACETAMINOPHEN 650 MG RE SUPP: 650.0000 mg | Freq: Four times a day (QID) | RECTAL | Status: DC | PRN

## 2020-07-15 MED ORDER — ONDANSETRON HCL 4 MG/2ML IJ SOLN: 4.0000 mg | Freq: Four times a day (QID) | INTRAMUSCULAR | Status: DC | PRN

## 2020-07-15 MED ORDER — HYDROXYZINE HCL 25 MG PO TABS
25.0000 mg | ORAL_TABLET | Freq: Once | ORAL | Status: AC
  Administered 2020-07-15: 25 mg via ORAL
  Filled 2020-07-15: qty 1

## 2020-07-15 MED ORDER — LACTATED RINGERS IV SOLN: INTRAVENOUS | Status: DC

## 2020-07-15 MED ORDER — ACETAMINOPHEN 325 MG PO TABS
650.0000 mg | ORAL_TABLET | Freq: Four times a day (QID) | ORAL | Status: DC | PRN
  Administered 2020-07-20: 650 mg via ORAL
  Filled 2020-07-15: qty 2

## 2020-07-15 NOTE — ED Notes (Signed)
Pt to MRI

## 2020-07-15 NOTE — ED Provider Notes (Signed)
[facility]-EMERGENCY DEPT Provider Note   CSN: [id number] Arrival date & time: 07/15/20  1526     History Chief Complaint  Patient presents with  . Weakness  . Wound Infection   Translator: Patient's granddaughter Brian Potter  Boneta LucksYtet Brian Potter is a 87 y.o. male.  Generalized weakness: Patient presenting to [facility] ED with 1 week of fatigue and weakness.  Patient was supposed to have an MRI of his right elbow wound today, but per the patient's granddaughter he could not get out of bed today.  Patient's granddaughter reports that about 2 days ago he started having diarrhea and has been vomiting.  She also reports that his stools are very dark, patient denied black stool/blood in his stool; however, FOBT on 07/04/2020 was positive.  Patient's wife also currently has foul-smelling diarrhea.  Patient warm and well-perfused on physical exam, however was recently admitted for lower GI bleed 2 weeks ago, was transfused 2u PRBC however refused endoscopy.  Patient was discharged home on pantoprazole 40 mg twice daily.  Denies fevers, headaches, shortness of breath, and chest pain.  Wound on right elbow: Patient has chronically deformed right elbow from previous injury from the [place].  He has been treated recently for purulent, draining wound of the right elbow.  Was previously treated with doxycycline x1 week and cefazolin in the ED 07/04/2020.   Swelling of left ankle: The patient reports progressively worsening left ankle pain and swelling.  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  The patient's left ankle has been swelling, turning red for the last month.  X-ray at last admission negative for evidence of osteomyelitis, however no MRI was performed.  Patient's ankle swelling/pain improved with doxycycline and indomethacin.  It was felt that patient's left ankle swelling was due to gout.  Is warm to the touch, erythematous, edematous, and  fluctuant on exam.  Patient was prescribed allopurinol 100 mg daily and colchicine 0.6 mg twice daily, as well as prednisone taper.       Past Medical History:  Diagnosis Date  . Diabetes mellitus without complication (HCC)   . Gout   . Hypertension     Patient Active Problem List   Diagnosis Date Noted  . SIRS (systemic inflammatory response syndrome) (HCC) 07/15/2020  . Generalized weakness 07/15/2020  . Open wound of right elbow 07/15/2020  . Ankle wound, left, initial encounter 07/15/2020  . Chronic tophaceous gout of left foot 07/07/2020  . Acute blood loss anemia 07/07/2020  . Gout of right elbow 07/07/2020  . ARF (acute renal failure) (HCC) 07/07/2020  . DNR (do not resuscitate) 07/07/2020    Past Surgical History:  Procedure Laterality Date  . HAND SURGERY       No family history on file.  Social History   Tobacco Use  . Smoking status: Former Smoker    Types: Pipe    Quit date: 07/04/2009    Years since quitting: 11.0  . Smokeless tobacco: Never Used  Vaping Use  . Vaping Use: Never used  Substance Use Topics  . Alcohol use: Yes    Comment: 1 bottle most days  . Drug use: Never    Home Medications Prior to Admission medications   Medication Sig Start Date End Date Taking? Authorizing Provider  allopurinol (ZYLOPRIM) 100 MG tablet Take 1 tablet (100 mg total) by mouth daily. 07/07/20 08/06/20  Jerald Kiefhiu, Stephen K, MD  colchicine 0.6 MG tablet Take 1 tablet (  0.6 mg total) by mouth 2 (two) times daily. 07/07/20 08/06/20  Jerald Kief, MD  pantoprazole (PROTONIX) 40 MG tablet Take 1 tablet (40 mg total) by mouth 2 (two) times daily. 07/07/20 09/05/20  Jerald Kief, MD  predniSONE (DELTASONE) 10 MG tablet Take 6 tablets (60 mg total) by mouth daily with breakfast for 2 days, THEN 4 tablets (40 mg total) daily with breakfast for 2 days, THEN 2 tablets (20 mg total) daily with breakfast for 2 days, THEN 1 tablet (10 mg total) daily with breakfast for 2 days, THEN  0.5 tablets (5 mg total) daily with breakfast for 2 days. 07/07/20 07/17/20  Jerald Kief, MD    Allergies    Patient has no known allergies.  Review of Systems   Review of Systems  Constitutional: Positive for activity change and fatigue. Negative for fever.  HENT: Negative.   Respiratory: Negative for cough, chest tightness and shortness of breath.   Cardiovascular: Negative for chest pain.  Gastrointestinal: Positive for diarrhea, nausea and vomiting. Negative for abdominal pain and blood in stool.  Endocrine: Positive for cold intolerance.  Genitourinary: Negative.   Musculoskeletal: Positive for arthralgias (Right elbow (chronic), left ankle) and joint swelling (Left ankle).  Skin: Positive for wound (Right elbow).  Neurological: Positive for weakness.    Physical Exam Updated Vital Signs BP (!) 150/58 (BP Location: Left Arm)   Pulse 90   Temp 98.3 F (36.8 C) (Oral)   Resp 16   Ht 5' (1.524 m)   Wt 50 kg   SpO2 96%   BMI 21.53 kg/m   Physical Exam Constitutional:      Appearance: He is ill-appearing.  HENT:     Right Ear: Tympanic membrane and ear canal normal.     Left Ear: Tympanic membrane and ear canal normal.     Nose: Nose normal.  Cardiovascular:     Rate and Rhythm: Tachycardia present. Rhythm irregular.     Pulses: Normal pulses.     Heart sounds: Normal heart sounds.  Pulmonary:     Effort: Pulmonary effort is normal.     Breath sounds: Normal breath sounds.  Abdominal:     General: Abdomen is flat. Bowel sounds are normal.  Musculoskeletal:        General: Swelling and tenderness present.     Cervical back: Normal range of motion.  Skin:    General: Skin is warm.     Capillary Refill: Capillary refill takes less than 2 seconds.     Findings: Lesion (Right elbow) present.  Neurological:     General: No focal deficit present.     Mental Status: He is alert.    ED Results / Procedures / Treatments   Labs (all labs ordered are listed, but  only abnormal results are displayed) Labs Reviewed  CBC WITH DIFFERENTIAL/PLATELET - Abnormal; Notable for the following components:      Result Value   WBC 14.7 (*)    Hemoglobin 12.9 (*)    Neutro Abs 11.3 (*)    Monocytes Absolute 1.7 (*)    Abs Immature Granulocytes 0.08 (*)    All other components within normal limits  COMPREHENSIVE METABOLIC PANEL - Abnormal; Notable for the following components:   Glucose, Bld 104 (*)    BUN 29 (*)    Calcium 8.4 (*)    Albumin 2.8 (*)    AST 50 (*)    GFR, Estimated 59 (*)    All other components  within normal limits  LACTIC ACID, PLASMA - Abnormal; Notable for the following components:   Lactic Acid, Venous 2.1 (*)    All other components within normal limits  CULTURE, BLOOD (ROUTINE X 2)  CULTURE, BLOOD (ROUTINE X 2)  C DIFFICILE QUICK SCREEN W PCR REFLEX  SARS CORONAVIRUS 2 (TAT 6-24 HRS)  LACTIC ACID, PLASMA  OCCULT BLOOD X 1 CARD TO LAB, STOOL    EKG EKG Interpretation  Date/Time:  Saturday July 15 2020 15:56:40 EST Ventricular Rate:  98 PR Interval:    QRS Duration: 120 QT Interval:  340 QTC Calculation: 435 R Axis:   -3 Text Interpretation: Sinus tachycardia Multiform ventricular premature complexes Right bundle branch block Artifact Abnormal ECG Confirmed by Gerhard Munch [phone]) on 07/15/2020 8:36:03 PM   Radiology DG Chest Portable 1 View  Result Date: 07/15/2020 CLINICAL DATA:  Sepsis.  Generalized weakness. EXAM: PORTABLE CHEST 1 VIEW COMPARISON:  None. FINDINGS: Low lung volumes. The heart is enlarged. Aortic atherosclerosis and tortuosity. No confluent consolidation. No large pleural effusion. No pneumothorax. No acute osseous abnormalities are seen. IMPRESSION: Low lung volumes with cardiomegaly. No evidence of congestive failure or acute pulmonary process. Electronically Signed   By: Narda Rutherford M.D.   On: 07/15/2020 19:21    Procedures Procedures   Medications Ordered in ED Medications  morphine 2  MG/ML injection 2 mg (2 mg Intravenous Given 07/15/20 2121)  hydrOXYzine (ATARAX/VISTARIL) tablet 25 mg (25 mg Oral Given 07/15/20 1920)  vancomycin (VANCOCIN) IVPB 1000 mg/200 mL premix (0 mg Intravenous Stopped 07/15/20 2046)  ceFEPIme (MAXIPIME) 2 g in sodium chloride 0.9 % 100 mL IVPB (0 g Intravenous Stopped 07/15/20 2046)    ED Course  I have reviewed the triage vital signs and the nursing notes.  Pertinent labs & imaging results that were available during my care of the patient were reviewed by me and considered in my medical decision making (see chart for details).  Generalized weakness: Patient persistently tachycardic up to 110, WBC 14.7, afebrile, respiratory rate normal.  Patient meeting SIRS criteria, with possible source of infection in patient's left ankle and/or right elbow patient meeting sepsis criteria.  Chest x-ray negative for acute process.  Blood culture drawn x2.  Lactic acid noted to be elevated at 2.1, repeat improved at 1.3. Patient started on vancomycin and cefepime.  No focal neurological deficits concerning for stroke. -Hospitalist paged for admission for treatment with IV antibiotics, physical therapy, potential placement as patient is unsafe to go home (family unable to care for patient with worsening weakness)  Left ankle swelling: Patient with left ankle swelling over the last month that has progressively worsened over the last 2 weeks.  Patient swelling improves when he was on antibiotics and indomethacin.  Patient does have a history of extensive gout to his left foot.  MRI of left ankle has been performed however has not yet been read.  (MRI of left foot negative for osteomyelitis, suggestive of gout).  Left ankle swelling is potential source of infection for patient (consider cellulitis, osteomyelitis).  Could also be due to gout of which patient has a significant history.  Right elbow wound: Patient with chronic deformity to his right elbow from previous war injury.   However, over the last several weeks has developed a wound to his right arm that was draining purulent fluid over the last week or 2.  Today, right elbow wound is without purulent drainage; however, is causing patient significant pain which is unchanged from his  baseline.  Were unable to perform MRI today as patient was unable to hold elbow in position needed for MRI.  Chronic conditions: CKD 3 a: GFR 59, creatinine 1.18  History of recent GI bleed  blood loss anemia: Patient has history of taking ibuprofen and Aleve for chronic gout and other chronic body aches.  Patient recently hospitalized with GI bleed, hemoglobin noted to be vision, patient was transfused 2 units PRBC, however declined endoscopy.  Patient reports diarrhea, denies blood in stool, pitch black stools.  Hemoglobin today is reassuring at 12.9.  Patient denies taking ibuprofen or Aleve recently.  T2DM: Per the daughter patient has a history of type 2 diabetes.  Is not currently treated, no A1c in history.  Hypertension: Patient hypertensive to 170 systolic, per family is not on any medication for high blood pressure at home.   CMP     Component Value Date/Time   NA 135 07/15/2020 1701   K 4.1 07/15/2020 1701   CL 103 07/15/2020 1701   CO2 23 07/15/2020 1701   GLUCOSE 104 (H) 07/15/2020 1701   BUN 29 (H) 07/15/2020 1701   CREATININE 1.18 07/15/2020 1701   CALCIUM 8.4 (L) 07/15/2020 1701   PROT 7.4 07/15/2020 1701   ALBUMIN 2.8 (L) 07/15/2020 1701   AST 50 (H) 07/15/2020 1701   ALT 24 07/15/2020 1701   ALKPHOS 81 07/15/2020 1701   BILITOT 0.5 07/15/2020 1701   GFRNONAA 59 (L) 07/15/2020 1701   GFRAA >60 04/22/2019 0909       MDM Rules/Calculators/A&P                           Final Clinical Impression(s) / ED Diagnoses Final diagnoses:  SIRS (systemic inflammatory response syndrome) (HCC)  Ankle wound, left, initial encounter  Open wound of right elbow, initial encounter  Generalized weakness    Rx / DC  Orders ED Discharge Orders    None       Dollene Cleveland, DO 07/15/20 2140    Gerhard Munch, MD 07/16/20 [phone]

## 2020-07-15 NOTE — H&P (Signed)
History and Physical    Brian Potter DPO:[id number] DOB: 10-19-1928 DOA: 07/15/2020  PCP: Patient, No Pcp Per  Patient coming from: Home  I have personally briefly reviewed patient's old medical records in [facility]  Chief Complaint: Weakness, ankle looking worse  HPI: Brian Potter is a 87 y.o. male with medical history significant of DM, gout, HTN.  Pt recently admitted to hospital from 2/22-2/25 for UGIB with anemia requiring transfusion as well as L foot gout.    UGIB and anemia: transfused, pt and family decided against further invasive work up but were okay with transfusion and meds as needed.  His HGB got as low as 6.5, but was up to 10.0 on discharge.  L foot swelling: MRI at that time was neg for osteomyelitis.  Felt that this was due to just gout flare.  Treated with colchicine and steroids.  Since discharge.  He has had diarrhea, but no frank melena nor hematochezia.  The swelling and erythema of his L foot has progressively worsened according to daughter.  He has had onset of generalized weakness for the past week.  Ankle has gotten to the point of being painful to where he cannot walk and today he didn't get out of bed.  This prompted granddaughter to bring him in to ED.  No fevers, chills, sweating.  ED Course: WBC 14k.  Tachy to 107.  MRI of ankle shows gout, but also shows fluid collection and bone marrow edema.  Formal read by [facility] radiologist will be completed in AM.  Pt given empiric cefepime and vanc in ED.  Stool for C.Diff ordered.  HGB is up to 12.9.   Review of Systems: As per HPI, otherwise all review of systems negative.  Past Medical History:  Diagnosis Date  . Diabetes mellitus without complication (HCC)   . Gout   . Hypertension     Past Surgical History:  Procedure Laterality Date  . HAND SURGERY       reports that he quit smoking about 11 years ago. His smoking use included pipe. He has never used smokeless tobacco. He reports current  alcohol use. He reports that he does not use drugs.  No Known Allergies  No family history on file. No reported sick contacts.  Prior to Admission medications   Medication Sig Start Date End Date Taking? Authorizing Provider  allopurinol (ZYLOPRIM) 100 MG tablet Take 1 tablet (100 mg total) by mouth daily. 07/07/20 08/06/20  Jerald Kief, MD  colchicine 0.6 MG tablet Take 1 tablet (0.6 mg total) by mouth 2 (two) times daily. 07/07/20 08/06/20  Jerald Kief, MD  pantoprazole (PROTONIX) 40 MG tablet Take 1 tablet (40 mg total) by mouth 2 (two) times daily. 07/07/20 09/05/20  Jerald Kief, MD  predniSONE (DELTASONE) 10 MG tablet Take 6 tablets (60 mg total) by mouth daily with breakfast for 2 days, THEN 4 tablets (40 mg total) daily with breakfast for 2 days, THEN 2 tablets (20 mg total) daily with breakfast for 2 days, THEN 1 tablet (10 mg total) daily with breakfast for 2 days, THEN 0.5 tablets (5 mg total) daily with breakfast for 2 days. 07/07/20 07/17/20  Jerald Kief, MD    Physical Exam: Vitals:   07/15/20 1841 07/15/20 1844 07/15/20 2022 07/15/20 2131  BP: (!) 147/94  (!) 150/58 (!) 145/60  Pulse: 70  90 64  Resp: 20 16 16 18   Temp:      TempSrc:  SpO2: 96%  96% 98%  Weight:      Height:        Constitutional: NAD, calm, comfortable, elderly and frail. Eyes: PERRL, lids and conjunctivae normal ENMT: Mucous membranes are moist. Posterior pharynx clear of any exudate or lesions.Normal dentition.  Neck: normal, supple, no masses, no thyromegaly Respiratory: clear to auscultation bilaterally, no wheezing, no crackles. Normal respiratory effort. No accessory muscle use.  Cardiovascular: IRR, IRR, no murmurs / rubs / gallops. No extremity edema. 2+ pedal pulses. No carotid bruits.  Abdomen: no tenderness, no masses palpated. No hepatosplenomegaly. Bowel sounds positive.  Musculoskeletal: no clubbing / cyanosis. No joint deformity upper and lower extremities. Good ROM, no  contractures. Normal muscle tone.  Skin: R elbow wound, L ankle erythema, edema, TTP Neurologic: CN 2-12 grossly intact. Sensation intact, DTR normal. Strength 5/5 in all 4.  Psychiatric: Normal judgment and insight. Alert and oriented x 3. Normal mood.    Labs on Admission: I have personally reviewed following labs and imaging studies  CBC: Recent Labs  Lab 07/15/20 1701  WBC 14.7*  NEUTROABS 11.3*  HGB 12.9*  HCT 40.5  MCV 89.2  PLT 394   Basic Metabolic Panel: Recent Labs  Lab 07/15/20 1701  NA 135  K 4.1  CL 103  CO2 23  GLUCOSE 104*  BUN 29*  CREATININE 1.18  CALCIUM 8.4*   GFR: Estimated Creatinine Clearance: 30 mL/min (by C-G formula based on SCr of 1.18 mg/dL). Liver Function Tests: Recent Labs  Lab 07/15/20 1701  AST 50*  ALT 24  ALKPHOS 81  BILITOT 0.5  PROT 7.4  ALBUMIN 2.8*   No results for input(s): LIPASE, AMYLASE in the last 168 hours. No results for input(s): AMMONIA in the last 168 hours. Coagulation Profile: No results for input(s): INR, PROTIME in the last 168 hours. Cardiac Enzymes: No results for input(s): CKTOTAL, CKMB, CKMBINDEX, TROPONINI in the last 168 hours. BNP (last 3 results) No results for input(s): PROBNP in the last 8760 hours. HbA1C: No results for input(s): HGBA1C in the last 72 hours. CBG: No results for input(s): GLUCAP in the last 168 hours. Lipid Profile: No results for input(s): CHOL, HDL, LDLCALC, TRIG, CHOLHDL, LDLDIRECT in the last 72 hours. Thyroid Function Tests: No results for input(s): TSH, T4TOTAL, FREET4, T3FREE, THYROIDAB in the last 72 hours. Anemia Panel: No results for input(s): VITAMINB12, FOLATE, FERRITIN, TIBC, IRON, RETICCTPCT in the last 72 hours. Urine analysis: No results found for: COLORURINE, APPEARANCEUR, LABSPEC, PHURINE, GLUCOSEU, HGBUR, BILIRUBINUR, KETONESUR, PROTEINUR, UROBILINOGEN, NITRITE, LEUKOCYTESUR  Radiological Exams on Admission: DG Chest Portable 1 View  Result Date:  07/15/2020 CLINICAL DATA:  Sepsis.  Generalized weakness. EXAM: PORTABLE CHEST 1 VIEW COMPARISON:  None. FINDINGS: Low lung volumes. The heart is enlarged. Aortic atherosclerosis and tortuosity. No confluent consolidation. No large pleural effusion. No pneumothorax. No acute osseous abnormalities are seen. IMPRESSION: Low lung volumes with cardiomegaly. No evidence of congestive failure or acute pulmonary process. Electronically Signed   By: Narda Rutherford M.D.   On: 07/15/2020 19:21    EKG: Independently reviewed. Irregular sinus tachycardia.  Assessment/Plan Principal Problem:   Ankle wound, left, initial encounter Active Problems:   Chronic tophaceous gout of left foot   SIRS (systemic inflammatory response syndrome) (HCC)   Generalized weakness   Open wound of right elbow    1. Ankle erythema - 1. Gout vs abscess + osteomyelitis 2. Ankle erythema and edema worse clinically since DC on 2/25 3. MRI today prelim readings  include bone marrow edema and large septated fluid collection 4. Radiologist wants [facility] radiologist to look at this in the AM before making a final call 5. Will leave pt on rocephin + Flagyl + Vanc for the moment 6. LE wound pathway 7. Depending on official read: may need ortho consult in AM. 8. Repeat CBC in AM 2. SIRS - 1. WBC and initial tachycardia 2. Abscess + osteomyelitis of foot, vs just SIRS secondary to gout 3. LR at 75 4. See above 5. Tele monitor 3. Generalized weakness - 1. Likely due to above 4. Recent UGIB - 1. Repeat CBC in AM 2. Tele monitor 3. HGB today is improved to 12.9 from 10.0 on discharge 2/25 1. Putting on LR at 75, thinking there may even be a degree of hemoconcentration here. 4. Has been having diarrhea. 5. Avoid NSAIDS 6. Pt declined invasive w/u last admit 7. C.Diff pending for the diarrhea 5. Open wound of elbow - 1. Wound care consult 6. DM2 and HTN - 1. Doesn't appear to be on any chronic meds for these.  DVT  prophylaxis: SCDs Code Status: DNR - confirmed with daughter Family Communication: Daughter at bedside Disposition Plan: TBD - may need SNF Consults called: None Admission status: Place in 47     Brian Potter M. DO [facility]  How to contact the [facility] Attending or Consulting provider 7A - 7P or covering provider during after hours 7P -7A, for this patient?  1. Check the care team in [facility] and look for a) attending/consulting [facility] provider listed and b) the [facility] team listed 2. Log into [email or web address]  Amion Physician Scheduling and messaging for groups and whole hospitals  On call and physician scheduling software for group practices, residents, hospitalists and other medical providers for call, clinic, rotation and shift schedules. On[employer] is a hospital-wide system for scheduling doctors and paging doctors on call. EasyPlot is for scientific plotting and data analysis.  [email or web address]  and use [facility] universal password to access. If you do not have the password, please contact the hospital operator.  3. Locate the [facility] provider you are looking for under [facility] and page to a number that you can be directly reached. 4. If you still have difficulty reaching the provider, please page the [facility] (Director on Call) for the Hospitalists listed on amion for assistance.  07/15/2020, 10:01 PM

## 2020-07-15 NOTE — Progress Notes (Signed)
Pharmacy Antibiotic Note  Brian Potter is a 87 y.o. male admitted on 07/15/2020 with diabetic foot infection.  MRI pending to rule out osteomyelitis. Pharmacy has been consulted for Vancomycin dosing.  Renal function at patient's baseline.   Plan: Rocephin/Flagyl per MD Vancomycin 500mg  IV q24h to target AUC 400-550 Check Vancomycin levels at steady state Monitor renal function and cx data   Height: 5' (152.4 cm) Weight: 50 kg (110 lb 3.7 oz) IBW/kg (Calculated) : 50  Temp (24hrs), Avg:98.3 F (36.8 C), Min:98.3 F (36.8 C), Max:98.3 F (36.8 C)  Recent Labs  Lab 07/15/20 1701 07/15/20 1859  WBC 14.7*  --   CREATININE 1.18  --   LATICACIDVEN 2.1* 1.3    Estimated Creatinine Clearance: 30 mL/min (by C-G formula based on SCr of 1.18 mg/dL).    No Known Allergies  Antimicrobials this admission: 3/5 Cefepime x1 3/5 Vanc >>  3/5 Flagyl >>  3/6 Rocephin >>  Dose adjustments this admission:  Microbiology results: 3/5 BCx:   Thank you for allowing pharmacy to be a part of this patient's care.  09/14/20 PharmD 07/15/2020 9:50 PM

## 2020-07-15 NOTE — ED Notes (Signed)
Called report to [facility] on 5W

## 2020-07-15 NOTE — ED Triage Notes (Signed)
Pt came from home via EMS. C/c: generalized weakness worsening since admission to hospital 2 weeks ago. Pt also concerned about wound on right elbow and swelling in left foot. Pt admitted for lower GI bleed 2 weeks ago, transfused, but refused endoscopy. Daughter is on her way to assist with language barrier.

## 2020-07-15 NOTE — Progress Notes (Signed)
A consult was received from an ED physician for vanc/cefepime per pharmacy dosing.  The patient's profile has been reviewed for ht/wt/allergies/indication/available labs.   A one time order has been placed for vanc 1g and cefepime 2g.  Further antibiotics/pharmacy consults should be ordered by admitting physician if indicated.                       Thank you, Berkley Harvey 07/15/2020  7:05 PM

## 2020-07-16 ENCOUNTER — Encounter (HOSPITAL_COMMUNITY): Payer: Medicare Other

## 2020-07-16 ENCOUNTER — Observation Stay (HOSPITAL_COMMUNITY): Payer: Medicare Other

## 2020-07-16 DIAGNOSIS — L089 Local infection of the skin and subcutaneous tissue, unspecified: Secondary | ICD-10-CM | POA: Diagnosis present

## 2020-07-16 DIAGNOSIS — I480 Paroxysmal atrial fibrillation: Secondary | ICD-10-CM | POA: Diagnosis not present

## 2020-07-16 DIAGNOSIS — M25572 Pain in left ankle and joints of left foot: Secondary | ICD-10-CM | POA: Diagnosis not present

## 2020-07-16 DIAGNOSIS — Z603 Acculturation difficulty: Secondary | ICD-10-CM | POA: Diagnosis present

## 2020-07-16 DIAGNOSIS — Z87891 Personal history of nicotine dependence: Secondary | ICD-10-CM | POA: Diagnosis not present

## 2020-07-16 DIAGNOSIS — I495 Sick sinus syndrome: Secondary | ICD-10-CM | POA: Diagnosis not present

## 2020-07-16 DIAGNOSIS — M1A9XX1 Chronic gout, unspecified, with tophus (tophi): Secondary | ICD-10-CM | POA: Diagnosis not present

## 2020-07-16 DIAGNOSIS — N1831 Chronic kidney disease, stage 3a: Secondary | ICD-10-CM | POA: Diagnosis present

## 2020-07-16 DIAGNOSIS — I10 Essential (primary) hypertension: Secondary | ICD-10-CM | POA: Diagnosis not present

## 2020-07-16 DIAGNOSIS — E1122 Type 2 diabetes mellitus with diabetic chronic kidney disease: Secondary | ICD-10-CM | POA: Diagnosis present

## 2020-07-16 DIAGNOSIS — R6510 Systemic inflammatory response syndrome (SIRS) of non-infectious origin without acute organ dysfunction: Secondary | ICD-10-CM | POA: Diagnosis not present

## 2020-07-16 DIAGNOSIS — Z20822 Contact with and (suspected) exposure to covid-19: Secondary | ICD-10-CM | POA: Diagnosis not present

## 2020-07-16 DIAGNOSIS — Z8719 Personal history of other diseases of the digestive system: Secondary | ICD-10-CM | POA: Diagnosis not present

## 2020-07-16 DIAGNOSIS — I129 Hypertensive chronic kidney disease with stage 1 through stage 4 chronic kidney disease, or unspecified chronic kidney disease: Secondary | ICD-10-CM | POA: Diagnosis present

## 2020-07-16 DIAGNOSIS — S51001A Unspecified open wound of right elbow, initial encounter: Secondary | ICD-10-CM | POA: Diagnosis not present

## 2020-07-16 DIAGNOSIS — M25472 Effusion, left ankle: Secondary | ICD-10-CM | POA: Diagnosis present

## 2020-07-16 DIAGNOSIS — I4891 Unspecified atrial fibrillation: Secondary | ICD-10-CM | POA: Diagnosis not present

## 2020-07-16 DIAGNOSIS — I351 Nonrheumatic aortic (valve) insufficiency: Secondary | ICD-10-CM | POA: Diagnosis not present

## 2020-07-16 DIAGNOSIS — W19XXXA Unspecified fall, initial encounter: Secondary | ICD-10-CM | POA: Diagnosis present

## 2020-07-16 DIAGNOSIS — R531 Weakness: Secondary | ICD-10-CM | POA: Diagnosis not present

## 2020-07-16 DIAGNOSIS — L02612 Cutaneous abscess of left foot: Secondary | ICD-10-CM | POA: Diagnosis not present

## 2020-07-16 DIAGNOSIS — Z66 Do not resuscitate: Secondary | ICD-10-CM | POA: Diagnosis not present

## 2020-07-16 DIAGNOSIS — M25521 Pain in right elbow: Secondary | ICD-10-CM | POA: Diagnosis not present

## 2020-07-16 DIAGNOSIS — S91002A Unspecified open wound, left ankle, initial encounter: Secondary | ICD-10-CM | POA: Diagnosis not present

## 2020-07-16 DIAGNOSIS — R197 Diarrhea, unspecified: Secondary | ICD-10-CM | POA: Diagnosis present

## 2020-07-16 DIAGNOSIS — I361 Nonrheumatic tricuspid (valve) insufficiency: Secondary | ICD-10-CM | POA: Diagnosis not present

## 2020-07-16 DIAGNOSIS — I4892 Unspecified atrial flutter: Secondary | ICD-10-CM | POA: Diagnosis not present

## 2020-07-16 LAB — COMPREHENSIVE METABOLIC PANEL
ALT: 16 U/L (ref 0–44)
AST: 37 U/L (ref 15–41)
Albumin: 1.8 g/dL — ABNORMAL LOW (ref 3.5–5.0)
Alkaline Phosphatase: 52 U/L (ref 38–126)
Anion gap: 9 (ref 5–15)
BUN: 24 mg/dL — ABNORMAL HIGH (ref 8–23)
CO2: 20 mmol/L — ABNORMAL LOW (ref 22–32)
Calcium: 7.4 mg/dL — ABNORMAL LOW (ref 8.9–10.3)
Chloride: 104 mmol/L (ref 98–111)
Creatinine, Ser: 0.96 mg/dL (ref 0.61–1.24)
GFR, Estimated: >60 mL/min (ref >60)
Glucose, Bld: 78 mg/dL (ref 70–99)
Potassium: 3.4 mmol/L — ABNORMAL LOW (ref 3.5–5.1)
Sodium: 133 mmol/L — ABNORMAL LOW (ref 135–145)
Total Bilirubin: 0.7 mg/dL (ref 0.3–1.2)
Total Protein: 4.8 g/dL — ABNORMAL LOW (ref 6.5–8.1)

## 2020-07-16 LAB — HEMOGLOBIN A1C
Hgb A1c MFr Bld: 6.3 % — ABNORMAL HIGH (ref 4.8–5.6)
Mean Plasma Glucose: 134.11 mg/dL

## 2020-07-16 LAB — GLUCOSE, CAPILLARY
Glucose-Capillary: 103 mg/dL — ABNORMAL HIGH (ref 70–99)
Glucose-Capillary: 66 mg/dL — ABNORMAL LOW (ref 70–99)
Glucose-Capillary: 100 mg/dL — ABNORMAL HIGH (ref 70–99)
Glucose-Capillary: 84 mg/dL (ref 70–99)

## 2020-07-16 LAB — SYNOVIAL CELL COUNT + DIFF, W/ CRYSTALS
Lymphocytes-Synovial Fld: 12 % (ref 0–20)
Monocyte-Macrophage-Synovial Fluid: 5 % — ABNORMAL LOW (ref 50–90)
Neutrophil, Synovial: 83 % — ABNORMAL HIGH (ref 0–25)
WBC, Synovial: 11460 /mm3 — ABNORMAL HIGH (ref 0–200)

## 2020-07-16 LAB — SARS CORONAVIRUS 2 (TAT 6-24 HRS): SARS Coronavirus 2: NEGATIVE

## 2020-07-16 LAB — CBC
HCT: 31.5 % — ABNORMAL LOW (ref 39.0–52.0)
Hemoglobin: 9.8 g/dL — ABNORMAL LOW (ref 13.0–17.0)
MCH: 28.1 pg (ref 26.0–34.0)
MCHC: 31.1 g/dL (ref 30.0–36.0)
MCV: 90.3 fL (ref 80.0–100.0)
Platelets: 267 10*3/uL (ref 150–400)
RBC: 3.49 MIL/uL — ABNORMAL LOW (ref 4.22–5.81)
RDW: 15.4 % (ref 11.5–15.5)
WBC: 12 10*3/uL — ABNORMAL HIGH (ref 4.0–10.5)
nRBC: 0 % (ref 0.0–0.2)

## 2020-07-16 LAB — HIV ANTIBODY (ROUTINE TESTING W REFLEX): HIV Screen 4th Generation wRfx: NONREACTIVE

## 2020-07-16 MED ORDER — COLCHICINE 0.6 MG PO TABS
0.6000 mg | ORAL_TABLET | Freq: Every day | ORAL | Status: DC
  Administered 2020-07-16 – 2020-07-21 (×6): 0.6 mg via ORAL
  Filled 2020-07-16 (×6): qty 1

## 2020-07-16 MED ORDER — MORPHINE SULFATE (PF) 2 MG/ML IV SOLN
2.0000 mg | INTRAVENOUS | Status: DC | PRN
  Administered 2020-07-18: 2 mg via INTRAVENOUS
  Filled 2020-07-16: qty 1

## 2020-07-16 MED ORDER — DEXAMETHASONE SODIUM PHOSPHATE 10 MG/ML IJ SOLN
8.0000 mg | Freq: Once | INTRAMUSCULAR | Status: DC
  Filled 2020-07-16: qty 1

## 2020-07-16 MED ORDER — METOPROLOL TARTRATE 5 MG/5ML IV SOLN
5.0000 mg | INTRAVENOUS | Status: AC | PRN
  Administered 2020-07-16 (×2): 5 mg via INTRAVENOUS
  Filled 2020-07-16 (×2): qty 5

## 2020-07-16 MED ORDER — INSULIN ASPART 100 UNIT/ML ~~LOC~~ SOLN
0.0000 [IU] | Freq: Three times a day (TID) | SUBCUTANEOUS | Status: DC
  Administered 2020-07-17 – 2020-07-18 (×2): 1 [IU] via SUBCUTANEOUS

## 2020-07-16 MED ORDER — OXYCODONE-ACETAMINOPHEN 5-325 MG PO TABS
1.0000 | ORAL_TABLET | ORAL | Status: DC | PRN
Start: 2020-07-16 — End: 2020-07-21
  Administered 2020-07-16 – 2020-07-17 (×5): 2 via ORAL
  Administered 2020-07-18: 1 via ORAL
  Administered 2020-07-18 – 2020-07-19 (×2): 2 via ORAL
  Administered 2020-07-19: 1 via ORAL
  Administered 2020-07-20: 2 via ORAL
  Administered 2020-07-20: 1 via ORAL
  Administered 2020-07-21: 2 via ORAL
  Filled 2020-07-16: qty 1
  Filled 2020-07-16 (×2): qty 2
  Filled 2020-07-16: qty 1
  Filled 2020-07-16 (×6): qty 2
  Filled 2020-07-16: qty 1
  Filled 2020-07-16: qty 2

## 2020-07-16 MED ORDER — LOSARTAN POTASSIUM 50 MG PO TABS
50.0000 mg | ORAL_TABLET | Freq: Every day | ORAL | Status: DC
  Administered 2020-07-16 – 2020-07-17 (×2): 50 mg via ORAL
  Filled 2020-07-16 (×2): qty 1

## 2020-07-16 MED ORDER — METOPROLOL TARTRATE 5 MG/5ML IV SOLN
5.0000 mg | Freq: Four times a day (QID) | INTRAVENOUS | Status: DC
  Administered 2020-07-16 (×2): 5 mg via INTRAVENOUS
  Filled 2020-07-16 (×2): qty 5

## 2020-07-16 MED ORDER — METOPROLOL TARTRATE 50 MG PO TABS
50.0000 mg | ORAL_TABLET | Freq: Two times a day (BID) | ORAL | Status: DC
  Administered 2020-07-16 – 2020-07-17 (×2): 50 mg via ORAL
  Filled 2020-07-16 (×3): qty 1

## 2020-07-16 NOTE — Consult Note (Addendum)
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 Referring Physician: Dr. Shella Maxim is an 87 y.o. male.  HPI: Orthopedics was consulted due to acute gout flare with some gouty tophus and fluid on the lateral aspect of his foot.  I was asked to aspirate this and send this to the lab.  They also discussed appropriateness of steroid infiltration of this area.  We are also asked to evaluate a right elbow wound for concern for septic joint.  Patient is [place] speaking.  His wife is at bedside.  We used an interpreter and family members and had difficult conversation due to language barrier.  They were amenable to aspiration.  He has had pain for several years.  He has had gout during that time.  He takes allopurinol.  Past Medical History:  Diagnosis Date  . Diabetes mellitus without complication (HCC)   . Gout   . Hypertension     Past Surgical History:  Procedure Laterality Date  . HAND SURGERY      History reviewed. No pertinent family history.  Social History:  reports that he quit smoking about 11 years ago. His smoking use included pipe. He has never used smokeless tobacco. He reports current alcohol use. He reports that he does not use drugs.  Allergies: No Known Allergies  Medications: I have reviewed the patient's current medications.  Results for orders placed or performed during the hospital encounter of 07/15/20 (from the past 48 hour(s))  CBC with Differential     Status: Abnormal   Collection Time: 07/15/20  5:01 PM  Result Value Ref Range   WBC 14.7 (H) 4.0 - 10.5 K/uL   RBC 4.54 4.22 - 5.81 MIL/uL   Hemoglobin 12.9 (L) 13.0 - 17.0 g/dL   HCT 96.0 45.4 - 09.8 %   MCV 89.2 80.0 - 100.0 fL   MCH 28.4 26.0 - 34.0 pg   MCHC 31.9 30.0 - 36.0 g/dL   RDW 11.9 14.7 - 82.9 %   Platelets 394 150 - 400 K/uL   nRBC 0.0 0.0 - 0.2 %   Neutrophils Relative % 76 %    Neutro Abs 11.3 (H) 1.7 - 7.7 K/uL   Lymphocytes Relative 11 %   Lymphs Abs 1.5 0.7 - 4.0 K/uL   Monocytes Relative 12 %   Monocytes Absolute 1.7 (H) 0.1 - 1.0 K/uL   Eosinophils Relative 0 %   Eosinophils Absolute 0.0 0.0 - 0.5 K/uL   Basophils Relative 0 %   Basophils Absolute 0.0 0.0 - 0.1 K/uL   Immature Granulocytes 1 %   Abs Immature Granulocytes 0.08 (H) 0.00 - 0.07 K/uL    Comment: Performed at [facility], [address] [place], [place] [address]  Comprehensive metabolic panel     Status: Abnormal   Collection Time: 07/15/20  5:01 PM  Result Value Ref Range   Sodium 135 135 - 145 mmol/L   Potassium 4.1 3.5 - 5.1 mmol/L   Chloride 103 98 - 111 mmol/L   CO2 23 22 - 32 mmol/L   Glucose, Bld 104 (H) 70 - 99 mg/dL    Comment: Glucose reference range applies only to samples taken after fasting for at least 8 hours.   BUN 29 (H) 8 - 23 mg/dL   Creatinine, Ser 0.86 0.61 - 1.24 mg/dL   Calcium 8.4 (L) 8.9 - 10.3 mg/dL   Total  Protein 7.4 6.5 - 8.1 g/dL   Albumin 2.8 (L) 3.5 - 5.0 g/dL   AST 50 (H) 15 - 41 U/L   ALT 24 0 - 44 U/L   Alkaline Phosphatase 81 38 - 126 U/L   Total Bilirubin 0.5 0.3 - 1.2 mg/dL   GFR, Estimated 59 (L) >60 mL/min    Comment: (NOTE) Calculated using the CKD-EPI Creatinine Equation (2021)    Anion gap 9 5 - 15    Comment: Performed at [facility], [address] [place], [place] [address]  Lactic acid, plasma     Status: Abnormal   Collection Time: 07/15/20  5:01 PM  Result Value Ref Range   Lactic Acid, Venous 2.1 (HH) 0.5 - 1.9 mmol/L    Comment: CRITICAL RESULT CALLED TO, READ BACK BY AND VERIFIED WITH: WOODY,A AT 1828 ON 07/15/2020 BY JPM Performed at [facility], [address] [place], [place] [address]   SARS CORONAVIRUS 2 (TAT 6-24 HRS) Nasopharyngeal Nasopharyngeal Swab     Status: None   Collection Time: 07/15/20  5:25 PM   Specimen: Nasopharyngeal Swab  Result Value Ref  Range   SARS Coronavirus 2 NEGATIVE NEGATIVE    Comment: (NOTE) SARS-CoV-2 target nucleic acids are NOT DETECTED.  The SARS-CoV-2 RNA is generally detectable in upper and lower respiratory specimens during the acute phase of infection. Negative results do not preclude SARS-CoV-2 infection, do not rule out co-infections with other pathogens, and should not be used as the sole basis for treatment or other patient management decisions. Negative results must be combined with clinical observations, patient history, and epidemiological information. The expected result is Negative.  Fact Sheet for Patients: HairSlick.no  Fact Sheet for Healthcare Providers: quierodirigir.com  This test is not yet approved or cleared by the [place] FDA and  has been authorized for detection and/or diagnosis of SARS-CoV-2 by FDA under an Emergency Use Authorization (EUA). This EUA will remain  in effect (meaning this test can be used) for the duration of the COVID-19 declaration under Se ction 564(b)(1) of the Act, 21 U.S.C. section 360bbb-3(b)(1), unless the authorization is terminated or revoked sooner.  Performed at [facility], [address], [place], [place] [address]   Blood culture (routine x 2)     Status: None (Preliminary result)   Collection Time: 07/15/20  5:35 PM   Specimen: BLOOD LEFT ARM  Result Value Ref Range   Specimen Description      BLOOD LEFT ARM Performed at [facility], [address], [place], [place] [address]    Special Requests      BOTTLES DRAWN AEROBIC AND ANAEROBIC Blood Culture adequate volume Performed at [facility], [address] [place], [place] [address]    Culture      NO GROWTH < 12 HOURS Performed at [facility], [address], [place], [place] [address]    Report Status PENDING   Lactic acid, plasma     Status: None   Collection Time:  07/15/20  6:59 PM  Result Value Ref Range   Lactic Acid, Venous 1.3 0.5 - 1.9 mmol/L    Comment: Performed at [facility], [address], [place], [place] [address]  Hemoglobin A1c     Status: Abnormal   Collection Time: 07/15/20  9:39 PM  Result Value Ref Range   Hgb A1c MFr Bld 6.3 (H) 4.8 - 5.6 %    Comment: (  NOTE) Pre diabetes:          5.7%-6.4%  Diabetes:              >6.4%  Glycemic control for   <7.0% adults with diabetes    Mean Plasma Glucose 134.11 mg/dL    Comment: Performed at [facility], [address], [place], [place] [address]  Sedimentation rate     Status: Abnormal   Collection Time: 07/15/20  9:39 PM  Result Value Ref Range   Sed Rate 41 (H) 0 - 16 mm/hr    Comment: Performed at [facility], [address], [place], [place] [address]  C-reactive protein     Status: Abnormal   Collection Time: 07/15/20  9:39 PM  Result Value Ref Range   CRP 7.7 (H) <1.0 mg/dL    Comment: Performed at [facility], [address], [place], [place] [address]  Prealbumin     Status: None   Collection Time: 07/15/20  9:39 PM  Result Value Ref Range   Prealbumin 23.3 18 - 38 mg/dL    Comment: Performed at [facility], [address], [place], [place] [address]  MRSA PCR Screening     Status: None   Collection Time: 07/15/20  9:39 PM   Specimen: Nasal Mucosa; Nasopharyngeal  Result Value Ref Range   MRSA by PCR NEGATIVE NEGATIVE    Comment:        The GeneXpert MRSA Assay (FDA approved for NASAL specimens only), is one component of a comprehensive MRSA colonization surveillance program. It is not intended to diagnose MRSA infection nor to guide or monitor treatment for MRSA infections. Performed at [facility], [address], [place], [place] [address]   CBC     Status: Abnormal   Collection Time: 07/16/20  3:32 AM  Result Value Ref Range   WBC 12.0 (H) 4.0 -  10.5 K/uL   RBC 3.49 (L) 4.22 - 5.81 MIL/uL   Hemoglobin 9.8 (L) 13.0 - 17.0 g/dL   HCT 13.0 (L) 86.5 - 78.4 %   MCV 90.3 80.0 - 100.0 fL   MCH 28.1 26.0 - 34.0 pg   MCHC 31.1 30.0 - 36.0 g/dL   RDW 69.6 29.5 - 28.4 %   Platelets 267 150 - 400 K/uL   nRBC 0.0 0.0 - 0.2 %    Comment: Performed at [facility], [address], [place], [place] [address]  Comprehensive metabolic panel     Status: Abnormal   Collection Time: 07/16/20  3:32 AM  Result Value Ref Range   Sodium 133 (L) 135 - 145 mmol/L   Potassium 3.4 (L) 3.5 - 5.1 mmol/L    Comment: DELTA CHECK NOTED   Chloride 104 98 - 111 mmol/L   CO2 20 (L) 22 - 32 mmol/L   Glucose, Bld 78 70 - 99 mg/dL    Comment: Glucose reference range applies only to samples taken after fasting for at least 8 hours.   BUN 24 (H) 8 - 23 mg/dL   Creatinine, Ser 0.10 0.61 - 1.24 mg/dL   Calcium 7.4 (L) 8.9 - 10.3 mg/dL   Total Protein 4.8 (L) 6.5 - 8.1 g/dL   Albumin 1.8 (L) 3.5 - 5.0 g/dL   AST 37 15 - 41 U/L   ALT 16 0 - 44 U/L   Alkaline Phosphatase 52 38 - 126 U/L   Total Bilirubin 0.7 0.3 - 1.2 mg/dL  GFR, Estimated >60 >60 mL/min    Comment: (NOTE) Calculated using the CKD-EPI Creatinine Equation (2021)    Anion gap 9 5 - 15    Comment: Performed at [facility], [address], [place], [place] [address]  Glucose, capillary     Status: Abnormal   Collection Time: 07/16/20 11:59 AM  Result Value Ref Range   Glucose-Capillary 103 (H) 70 - 99 mg/dL    Comment: Glucose reference range applies only to samples taken after fasting for at least 8 hours.    MR ANKLE LEFT WO CONTRAST  Result Date: 07/16/2020 CLINICAL DATA:  Diabetic patient with pain, redness and swelling of the left ankle. History of gout. EXAM: MRI OF THE LEFT ANKLE WITHOUT CONTRAST TECHNIQUE: Multiplanar, multisequence MR imaging of the left ankle was performed. No intravenous contrast was administered. COMPARISON:  Plain films left  foot 07/04/2020. FINDINGS: Bones/Joint/Cartilage Marrow edema and erosions are seen in both the medial and lateral malleoli, calcaneus and visualized bones of the midfoot. Edema is most extensive in the calcaneus and erosive change is worst about the subtalar joint. No fracture. 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 Otherwise negative. Muscles and Tendons There is a large volume of fluid in the sheaths of the peroneal and posteromedial tendons with extensive synovial thickening and likely calcifications. There is also a large volume of fluid in the extensor digitorum tendons. No tear is identified. Soft tissues Soft tissue edema is present about the foot. There is a septated fluid collection along the lateral calcaneus measuring approximately 4 cm craniocaudal by 7 cm AP x 2 cm transverse. Calcifications in the soft tissues and fluid collections are better visualized on the prior plain films. IMPRESSION: Findings as described above are highly favored represent severe gouty arthropathy rather than osteomyelitis, septic tenosynovitis and abscess. The septated fluid collection along the lateral calcaneus is immediately be the skin surface and should be easily amenable to bedside aspiration which could assist in definitive diagnosis. Electronically Signed   By: Drusilla Kanner M.D.   On: 07/16/2020 09:19   DG Chest Portable 1 View  Result Date: 07/15/2020 CLINICAL DATA:  Sepsis.  Generalized weakness. EXAM: PORTABLE CHEST 1 VIEW COMPARISON:  None. FINDINGS: Low lung volumes. The heart is enlarged. Aortic atherosclerosis and tortuosity. No confluent consolidation. No large pleural effusion. No pneumothorax. No acute osseous abnormalities are seen. IMPRESSION: Low lung volumes with cardiomegaly. No evidence of congestive failure or acute pulmonary process. Electronically Signed   By: Narda Rutherford M.D.   On: 07/15/2020 19:21    Review of Systems  Unable to  perform ROS: Patient nonverbal   Blood pressure (!) 154/57, pulse 67, temperature 97.7 F (36.5 C), temperature source Oral, resp. rate 14, height 5' (1.524 m), weight 42.4 kg, SpO2 96 %. Physical Exam Constitutional:      General: He is in acute distress.     Appearance: He is ill-appearing.  HENT:     Head: Normocephalic.     Mouth/Throat:     Mouth: Mucous membranes are dry.  Eyes:     Extraocular Movements: Extraocular movements intact.  Cardiovascular:     Rate and Rhythm: Normal rate.     Pulses: Normal pulses.  Pulmonary:     Effort: Pulmonary effort is normal.  Abdominal:     General: Abdomen is flat.  Musculoskeletal:     Cervical back: Neck supple.     Comments: Evidence of gouty changes about his hands  as well as the left foot and ankle specifically.  There is a fluctuant area of fluid on the lateral aspect of his foot along the calcaneus and lateral foot.  There is also evidence of a small wound on the posterior aspect of his right elbow where it appears that a gouty tophus has breach the skin.  There is some serous drainage in this area.  He has virtually locked right elbow with no range of motion.  Pain with attempted range of motion of the left ankle and hindfoot.  Skin:    General: Skin is warm.     Findings: Bruising and lesion present.  Neurological:     General: No focal deficit present.     Assessment/Plan: Patient has known destructive gout.  X-rays of his foot taken in February and repeat x-rays during this admission demonstrate significant amount of bony destruction due to gout with soft tissue swelling.  MRI of the foot and ankle demonstrate large fluid collection on the posterior and medial aspect of his foot and ankle consistent with gout.  There is some joint destruction of the subtalar joint and ankle joint as well.  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  There is bony prominence posteriorly.  He has no range of motion of the elbow and pain.  I performed a bedside aspiration of the fluid collection on the left ankle and foot and a milky fluid was obtained.  It did not have a foul odor.  The fluid will be sent for culture as well as crystal evaluation.  I did not infiltrate any steroids as given the amount of fluid there is concern for this being infectious etiology.  Would recommend continued gout treatment and consider colchicine if appropriate due to his other health conditions.  With regard to his right elbow he has a small wound on the posterior aspect of his elbow that may represent skin erosion from bony prominence versus gouty tophus extrusion.  I would recommend wound care consult for possible wet-to-dry dressing changes on this.  Would recommend against surgery at this point.  We will follow up the laboratory values to ensure he needs no formal I&D of his left foot or ankle.  Procedure: After consent was obtained the lateral aspect of the foot overlying the fluctuance was sterilized with chlorhexidine.  Then an 18-gauge needle was used to aspirate fluid from this fluid collection in and around the peroneal tendon sheath.  Were able to aspirate 20 to 30 cc of a milky fluid.  There was no foul odor.  The patient tolerated this well.  A soft dressing was placed.  The fluid will be sent for laboratory evaluation.  Terance Hart 07/16/2020, 2:34 PM

## 2020-07-16 NOTE — Progress Notes (Signed)
   07/16/20 0207  Assess: MEWS Score  Temp 99.8 F (37.7 C)  BP (!) 158/91  Pulse Rate (!) 155  Resp 18  Level of Consciousness Alert  SpO2 (!) 69 %  O2 Device Room Air  Patient Activity (if Appropriate) In bed  Assess: MEWS Score  MEWS Temp 0  MEWS Systolic 0  MEWS Pulse 3  MEWS RR 0  MEWS LOC 0  MEWS Score 3  MEWS Score Color Yellow  Assess: if the MEWS score is Yellow or Red  Were vital signs taken at a resting state? Yes  Focused Assessment No change from prior assessment  Early Detection of Sepsis Score *See Row Information* Low  MEWS guidelines implemented *See Row Information* No, previously yellow, continue vital signs every 4 hours  Treat  Pain Scale PAINAD  Pain Score 0  2nd Pain Site  Pain Score Asleep  Take Vital Signs  Increase Vital Sign Frequency  Yellow: Q 2hr X 2 then Q 4hr X 2, if remains yellow, continue Q 4hrs  Escalate  MEWS: Escalate Yellow: discuss with charge nurse/RN and consider discussing with provider and RRT  Notify: Charge Nurse/RN  Name of Charge Nurse/RN Notified jOSE  Date Charge Nurse/RN Notified 07/16/20  Time Charge Nurse/RN Notified 0200  Notify: Provider  Provider Name/Title =X BLOUNT  Date Provider Notified 07/16/20  Time Provider Notified 0210  Notification Type Page  Notification Reason Change in status  Provider response See new orders  Date of Provider Response 07/16/20  Time of Provider Response 0220  Notify: Rapid Response  Name of Rapid Response RN Notified GENELLE  Date Rapid Response Notified 07/16/20  Time Rapid Response Notified 0200  Document  Patient Outcome Stabilized after interventions  Progress note created (see row info) Yes

## 2020-07-16 NOTE — Progress Notes (Signed)
Rapid Response Event Note   Reason for Call : Pt HR 160's   Initial Focused Assessment: pt unable to speak English, however Grand-daughter to translate.  Pt denies Chest Pain.  Pt does have pain in legs and right arm (gout?).  Able to follow commands and oriented.  No s/s of distress noted.    Interventions: See chart for VS, pt HR in 150's EKG shows A-fib.  I believe new onset. BP stable.  TRIAD,NP informed pt given lopressor 5 mg x 2 and MSO4 2mg  iv per orders.  HR decreased to the 110's not controlled but still A-fib per EKG.  Will check labs, also. Pt RN to f/u with NP r/t results.   Plan of Care: Pt will remain in current location at this time.    Event Summary:   MD Notified:  Call Time: 0200 Arrival Time: 0206 End Time: 0330  0207, RN

## 2020-07-16 NOTE — Progress Notes (Signed)
PT Cancellation Note  Patient Details Name: Brian Potter MRN: [id number] DOB: 01/10/1929   Cancelled Treatment:    Reason Eval/Treat Not Completed: Medical issues which prohibited therapy, BP elevated at 160/122. Rapid Response early this am d/t incr HR, possible new onset afib. Defer PT eval at this time. (pt with recent admission  06/2020).   [facility] 07/16/2020, 10:08 AM

## 2020-07-16 NOTE — Progress Notes (Addendum)
PROGRESS NOTE    Brian Potter  VVO:[id number] DOB: 01/12/1929 DOA: 07/15/2020 PCP: Patient, No Pcp Per   Brief Narrative: Brian Potter is a 87 y.o. male with a history of gout, diabetes mellitus. Patient presented secondary to weakness and ankle pain with concern for acute gout. He was recently treated for ankle cellulitis. He also had a right elbow wound. Empiric antibiotics initiated. MRI of left ankle suggests severe gout flare.   Assessment & Plan:   Principal Problem:   Ankle wound, left, initial encounter Active Problems:   Chronic tophaceous gout of left foot   SIRS (systemic inflammatory response syndrome) (HCC)   Generalized weakness   Open wound of right elbow   Ankle pain This appears to be most consistent with severe acute gout. MRI confirms likely gout flare. Recently treated for cellulitis so this has been occurring for almost two weeks; colchicine will likely not be helpful. -Orthopedic surgery consult for aspiration/steroid injection -Continue Tylenol/Morphine prn for pain  SIRS Present on admission. No definitive infectious source identified yet. -Continue Vancomycin/Ceftriaxone/Flagyl  Left elbow wound From fall. Per history, there appears to have been drainage. Concern would be for possible septic joint. Unable to obtain MRI on admission, unfortunately. X-ray without effusion, but does show subcutaneous gas. -Wound care recommendations pending -Orthopedic recommendations for possible aspiration -Continue antibiotics empirically as mentioned above  Arrhythmia Concern for possible atrial fibrillation but EKG looks more like flutter on my evaluation. Associated rapid ventricular response. -Metoprolol 25 mg BID -Transthoracic Echocardiogram; cardiology consult in AM -Hold Anticoagulation pending surgical evaluation/management  Generalized weakness -PT/OT evaluation  Diabetes mellitus, type 2 Hemoglobin A1C of 6.3%  Primary hypertension History but not on  medication per chart review. Blood pressure uncontrolled.  -Start losartan 50 mg daily in setting of diabetes diagnosis; will need outpatient BMP for follow-up  History of tophaceous gout Possibly having an acute gout flare. He is on allopurinol and colchicine as an outpatient  History of upper GI bleeding Noted   DVT prophylaxis: SCDs Code Status:   Code Status: DNR Family Communication: Sister at bedside Disposition Plan: Discharge likely home in several days pending improvement of pain/treatment of gout flare   Consultants:   None  Procedures:   None  Antimicrobials:  Vancomycin  Ceftriaxone  Flagyl    Subjective:  Interpreter: Attempted to contact an interpreter but failed.  Pain. Difficult secondary to language barrier. Sister was present but also was not helpful with regard to translation.  Objective: Vitals:   07/16/20 0207 07/16/20 0233 07/16/20 0316 07/16/20 0506  BP: (!) 158/91 (!) 154/117 136/76 (!) 160/122  Pulse: (!) 155 99 (!) 118 86  Resp: 18   20  Temp: 99.8 F (37.7 C)   (!) 97.5 F (36.4 C)  TempSrc:    Oral  SpO2: (!) 69%   97%  Weight:      Height:        Intake/Output Summary (Last 24 hours) at 07/16/2020 0728 Last data filed at 07/16/2020 0328 Gross per 24 hour  Intake 272.05 ml  Output --  Net 272.05 ml   Filed Weights   07/15/20 1558 07/15/20 2022  Weight: 50 kg 42.4 kg    Examination:  General exam: Appears calm and comfortable. Thin appearing. Respiratory system: Respiratory effort normal. Gastrointestinal system: Abdomen is nondistended, soft and nontender. No organomegaly or masses felt. Central nervous system: Alert Musculoskeletal: Left ankle/foot is severely swollen, erythematous and tender. Multiple tophi seen on digits. Right elbow with laceration/incision with  no active drainage and surrounding swelling and erythema/tenderness Skin: No cyanosis. No rashes Psychiatry: Difficult to ascertain secondary to language  barrier    Data Reviewed: I have personally reviewed following labs and imaging studies  CBC Lab Results  Component Value Date   WBC 12.0 (H) 07/16/2020   RBC 3.49 (L) 07/16/2020   HGB 9.8 (L) 07/16/2020   HCT 31.5 (L) 07/16/2020   MCV 90.3 07/16/2020   MCH 28.1 07/16/2020   PLT 267 07/16/2020   MCHC 31.1 07/16/2020   RDW 15.4 07/16/2020   LYMPHSABS 1.5 07/15/2020   MONOABS 1.7 (H) 07/15/2020   EOSABS 0.0 07/15/2020   BASOSABS 0.0 07/15/2020     Last metabolic panel Lab Results  Component Value Date   NA 133 (L) 07/16/2020   K 3.4 (L) 07/16/2020   CL 104 07/16/2020   CO2 20 (L) 07/16/2020   BUN 24 (H) 07/16/2020   CREATININE 0.96 07/16/2020   GLUCOSE 78 07/16/2020   GFRNONAA >60 07/16/2020   GFRAA >60 04/22/2019   CALCIUM 7.4 (L) 07/16/2020   PROT 4.8 (L) 07/16/2020   ALBUMIN 1.8 (L) 07/16/2020   BILITOT 0.7 07/16/2020   ALKPHOS 52 07/16/2020   AST 37 07/16/2020   ALT 16 07/16/2020   ANIONGAP 9 07/16/2020    CBG (last 3)  No results for input(s): GLUCAP in the last 72 hours.   GFR: Estimated Creatinine Clearance: 31.3 mL/min (by C-G formula based on SCr of 0.96 mg/dL).  Coagulation Profile: No results for input(s): INR, PROTIME in the last 168 hours.  Recent Results (from the past 240 hour(s))  SARS CORONAVIRUS 2 (TAT 6-24 HRS) Nasopharyngeal Nasopharyngeal Swab     Status: None   Collection Time: 07/15/20  5:25 PM   Specimen: Nasopharyngeal Swab  Result Value Ref Range Status   SARS Coronavirus 2 NEGATIVE NEGATIVE Final    Comment: (NOTE) SARS-CoV-2 target nucleic acids are NOT DETECTED.  The SARS-CoV-2 RNA is generally detectable in upper and lower respiratory specimens during the acute phase of infection. Negative results do not preclude SARS-CoV-2 infection, do not rule out co-infections with other pathogens, and should not be used as the sole basis for treatment or other patient management decisions. Negative results must be combined with  clinical observations, patient history, and epidemiological information. The expected result is Negative.  Fact Sheet for Patients: HairSlick.no  Fact Sheet for Healthcare Providers: quierodirigir.com  This test is not yet approved or cleared by the [place] FDA and  has been authorized for detection and/or diagnosis of SARS-CoV-2 by FDA under an Emergency Use Authorization (EUA). This EUA will remain  in effect (meaning this test can be used) for the duration of the COVID-19 declaration under Se ction 564(b)(1) of the Act, 21 U.S.C. section 360bbb-3(b)(1), unless the authorization is terminated or revoked sooner.  Performed at [facility], [address], [place], [place] [address]   MRSA PCR Screening     Status: None   Collection Time: 07/15/20  9:39 PM   Specimen: Nasal Mucosa; Nasopharyngeal  Result Value Ref Range Status   MRSA by PCR NEGATIVE NEGATIVE Final    Comment:        The GeneXpert MRSA Assay (FDA approved for NASAL specimens only), is one component of a comprehensive MRSA colonization surveillance program. It is not intended to diagnose MRSA infection nor to guide or monitor treatment for MRSA infections. Performed at [facility], [address] [place], [place] [address]  Radiology Studies: DG Chest Portable 1 View  Result Date: 07/15/2020 CLINICAL DATA:  Sepsis.  Generalized weakness. EXAM: PORTABLE CHEST 1 VIEW COMPARISON:  None. FINDINGS: Low lung volumes. The heart is enlarged. Aortic atherosclerosis and tortuosity. No confluent consolidation. No large pleural effusion. No pneumothorax. No acute osseous abnormalities are seen. IMPRESSION: Low lung volumes with cardiomegaly. No evidence of congestive failure or acute pulmonary process. Electronically Signed   By: Narda Rutherford M.D.   On: 07/15/2020 19:21        Scheduled Meds: . metoprolol tartrate   5 mg Intravenous Q6H  . metroNIDAZOLE  500 mg Oral Q8H   Continuous Infusions: . cefTRIAXone (ROCEPHIN)  IV    . lactated ringers 75 mL/hr at 07/15/20 2350  . vancomycin       LOS: 0 days     Jacquelin Hawking, MD [facility] 07/16/2020, 7:28 AM  If 7PM-7AM, please contact night-coverage [email or web address]

## 2020-07-16 NOTE — Progress Notes (Signed)
James Senn RRT CN - discussed with primary RN - [place] obtaining Mg level with AM labs to and BMET due to patients A-flutter and K+3.4.  Nurse will continue to monitor MEWS score.

## 2020-07-16 NOTE — Plan of Care (Signed)

## 2020-07-16 NOTE — Progress Notes (Signed)
Failed attempt at MRI of elbow yesterday. Pt unable to get into correct positioning or move elbow at all without screaming in order to get into the camera. Once starting, the patient removed imaging equipment.

## 2020-07-17 ENCOUNTER — Inpatient Hospital Stay (HOSPITAL_COMMUNITY): Payer: Medicare Other

## 2020-07-17 DIAGNOSIS — I10 Essential (primary) hypertension: Secondary | ICD-10-CM

## 2020-07-17 DIAGNOSIS — I351 Nonrheumatic aortic (valve) insufficiency: Secondary | ICD-10-CM | POA: Diagnosis not present

## 2020-07-17 DIAGNOSIS — I361 Nonrheumatic tricuspid (valve) insufficiency: Secondary | ICD-10-CM | POA: Diagnosis not present

## 2020-07-17 DIAGNOSIS — I480 Paroxysmal atrial fibrillation: Secondary | ICD-10-CM

## 2020-07-17 DIAGNOSIS — I4892 Unspecified atrial flutter: Secondary | ICD-10-CM

## 2020-07-17 DIAGNOSIS — I495 Sick sinus syndrome: Secondary | ICD-10-CM

## 2020-07-17 LAB — MAGNESIUM: Magnesium: 1.8 mg/dL (ref 1.7–2.4)

## 2020-07-17 LAB — CBC
HCT: 31.6 % — ABNORMAL LOW (ref 39.0–52.0)
Hemoglobin: 10 g/dL — ABNORMAL LOW (ref 13.0–17.0)
MCH: 28.8 pg (ref 26.0–34.0)
MCHC: 31.6 g/dL (ref 30.0–36.0)
MCV: 91.1 fL (ref 80.0–100.0)
Platelets: 225 10*3/uL (ref 150–400)
RBC: 3.47 MIL/uL — ABNORMAL LOW (ref 4.22–5.81)
RDW: 15.4 % (ref 11.5–15.5)
WBC: 8.5 10*3/uL (ref 4.0–10.5)
nRBC: 0 % (ref 0.0–0.2)

## 2020-07-17 LAB — ECHOCARDIOGRAM COMPLETE
Height: 60 in
P 1/2 time: 391 msec
S' Lateral: 2.4 cm
Weight: 1495.6 oz

## 2020-07-17 LAB — BASIC METABOLIC PANEL
Anion gap: 11 (ref 5–15)
BUN: 25 mg/dL — ABNORMAL HIGH (ref 8–23)
CO2: 19 mmol/L — ABNORMAL LOW (ref 22–32)
Calcium: 7.5 mg/dL — ABNORMAL LOW (ref 8.9–10.3)
Chloride: 105 mmol/L (ref 98–111)
Creatinine, Ser: 1.09 mg/dL (ref 0.61–1.24)
GFR, Estimated: >60 mL/min (ref >60)
Glucose, Bld: 138 mg/dL — ABNORMAL HIGH (ref 70–99)
Potassium: 3.3 mmol/L — ABNORMAL LOW (ref 3.5–5.1)
Sodium: 135 mmol/L (ref 135–145)

## 2020-07-17 LAB — GLUCOSE, CAPILLARY
Glucose-Capillary: 124 mg/dL — ABNORMAL HIGH (ref 70–99)
Glucose-Capillary: 103 mg/dL — ABNORMAL HIGH (ref 70–99)
Glucose-Capillary: 118 mg/dL — ABNORMAL HIGH (ref 70–99)
Glucose-Capillary: 110 mg/dL — ABNORMAL HIGH (ref 70–99)

## 2020-07-17 MED ORDER — AMIODARONE HCL 200 MG PO TABS
200.0000 mg | ORAL_TABLET | Freq: Two times a day (BID) | ORAL | Status: DC
  Administered 2020-07-17 – 2020-07-21 (×8): 200 mg via ORAL
  Filled 2020-07-17 (×8): qty 1

## 2020-07-17 MED ORDER — METOPROLOL TARTRATE 25 MG PO TABS: 25.0000 mg | ORAL_TABLET | Freq: Two times a day (BID) | ORAL | Status: DC

## 2020-07-17 MED ORDER — METOPROLOL TARTRATE 25 MG PO TABS: 37.5000 mg | ORAL_TABLET | Freq: Two times a day (BID) | ORAL | Status: DC

## 2020-07-17 MED ORDER — JUVEN PO PACK
1.0000 | PACK | Freq: Two times a day (BID) | ORAL | Status: DC
  Administered 2020-07-18 – 2020-07-21 (×7): 1 via ORAL
  Filled 2020-07-17 (×9): qty 1

## 2020-07-17 MED ORDER — METOPROLOL TARTRATE 25 MG PO TABS
37.5000 mg | ORAL_TABLET | Freq: Two times a day (BID) | ORAL | Status: DC
  Administered 2020-07-17 – 2020-07-21 (×8): 37.5 mg via ORAL
  Filled 2020-07-17 (×9): qty 1

## 2020-07-17 MED ORDER — ENSURE ENLIVE PO LIQD
237.0000 mL | Freq: Two times a day (BID) | ORAL | Status: DC
  Administered 2020-07-17 – 2020-07-21 (×8): 237 mL via ORAL

## 2020-07-17 MED ORDER — AMIODARONE HCL 200 MG PO TABS
200.0000 mg | ORAL_TABLET | Freq: Two times a day (BID) | ORAL | Status: DC
  Filled 2020-07-17: qty 1

## 2020-07-17 NOTE — Evaluation (Signed)
Occupational Therapy Evaluation Patient Details Name: Brian Potter MRN: [id number] DOB: [birth year] Today's Date: 07/17/2020    History of Present Illness 87 y.o. male with past medical history significant for CKD stage IIIa presents to ED 07/15/20, with complaint of pain in the right elbow and left lower extremity. Recently in  [facility] w/ Dx of anemia possibly 2* GIB, DC 07/07/20. Imaging showed tophaceous gout L ankle and chronic R elbow fracture deformity.   Clinical Impression   Patient is currently requiring assistance with ADLs including total assist with toileting, total assist with LE dressing, maximum assist with bed level bathing, and moderate-max assist with UE dressing and grooming at bed level, all of which may be below patient's baseline at home, although PLOF information was limited due to language barrier and lack of correct dialect availability on IPAD.  During this evaluation, patient was limited by pain to Lt ankle/foot and RUE as well as generalized weakness, lethargy and poor activity tolerance, which has the potential to impact patient's safety and independence during functional mobility, as well as performance for ADLs. [employer] AM-PAC "6-clicks" Daily Activity Inpatient Short Form score of 11/24 indicates 70.42% ADL impairment this session. Patient lives with family, who is presumably able to provide 24/7 supervision and assistance.  Patient demonstrates fair rehab potential, and should benefit from continued skilled occupational therapy services while in acute care to maximize safety, independence and quality of life at home.  Continued occupational therapy services in a SNF setting prior to return home is recommended.  ?    Follow Up Recommendations  SNF    Equipment Recommendations       Recommendations for Other Services       Precautions / Restrictions Precautions Precautions: Fall Precaution Comments: significant pain left foot and right elbow,  grimaces Restrictions Weight Bearing Restrictions: No      Mobility Bed Mobility Overal bed mobility: Needs Assistance Bed Mobility: Supine to Sit           General bed mobility comments: attempted to mobilize, patient indicating lt foot pain and unable to procede with mobility. Repositioned lt leg and rt UE.. "I can't sit up".  Floated heels.    Transfers                 General transfer comment: NT    Balance Overall balance assessment:  (TBA)                                         ADL either performed or assessed with clinical judgement   ADL Overall ADL's : Needs assistance/impaired Eating/Feeding: Minimal assistance;Bed level   Grooming: Moderate assistance;Bed level Grooming Details (indicate cue type and reason): due to lethargy. Upper Body Bathing: Moderate assistance;Bed level   Lower Body Bathing: Maximal assistance;Bed level   Upper Body Dressing : Maximal assistance;Bed level Upper Body Dressing Details (indicate cue type and reason): Max Assist to adjust pt's hospital gown at bed level. Lower Body Dressing: Total assistance Lower Body Dressing Details (indicate cue type and reason): Pt unable to tolerate touch to Lt ankle/foot including pt's sheets.  RT foot: Total As bed level   Toilet Transfer Details (indicate cue type and reason): Unable to assess due to pt pain. Toileting- Clothing Manipulation and Hygiene: Bed level;Total assistance Toileting - Clothing Manipulation Details (indicate cue type and reason): Pt using external male catheter. Unable to mobilize OOB  today due to pain.     Functional mobility during ADLs: Total assistance;+2 for physical assistance General ADL Comments: Total Assist x 2 people to scoot pt posteriorly in supine to HOB. Stopped attempt to mobilize to EOB due to pt's pain when LLE placed in dependent position.     Vision   Additional Comments: Unable to assess this visit due to pt lethargy. Pt not  making eye contact, eyes often closed.     Perception     Praxis      Pertinent Vitals/Pain Pain Assessment: Faces Faces Pain Scale: Hurts worst Pain Location: L ankle with movement, right elbow Pain Descriptors / Indicators: Grimacing;Guarding Pain Intervention(s): Limited activity within patient's tolerance;Monitored during session;Repositioned     Hand Dominance     Extremity/Trunk Assessment Upper Extremity Assessment Upper Extremity Assessment: RUE deficits/detail;LUE deficits/detail RUE Deficits / Details: Chronic elbow deformity - limited elbow extension, gouty changes in right hand. Pt guarding RUE, but did squeeze therapist's hand to ~2+/5. RUE: Unable to fully assess due to pain LUE Deficits / Details: grossly functional ROM, but weak appearing.  Grip strength ~3+/5   Lower Extremity Assessment Lower Extremity Assessment: Defer to PT evaluation RLE Deficits / Details: lifts leg from bed easily LLE Deficits / Details: limited assessment due to apparant pain of lt foot and rt elbow, LLE:  (edematous ankle and foot)   Cervical / Trunk Assessment Cervical / Trunk Assessment: Other exceptions Cervical / Trunk Exceptions: nt   Communication Communication Communication: Prefers language other than English;Interpreter utilized   Cognition Arousal/Alertness: Lethargic Behavior During Therapy: Flat affect Overall Cognitive Status: Difficult to assess                                 General Comments: pt. keeping eyes closed. minimal  responses to questions via interpreter by dtr.   General Comments       Exercises     Shoulder Instructions      Home Living Family/patient expects to be discharged to:: Private residence Living Arrangements: Spouse/significant other;Children;Other relatives Available Help at Discharge: Family;Available 24 hours/day   Home Access: Stairs to enter Entrance Stairs-Number of Steps: 2         Bathroom Shower/Tub:  Tub/shower unit         Home Equipment: Cane - single point;Wheelchair - manual          Prior Functioning/Environment Level of Independence: Independent with assistive device(s)        Comments: per daughter in room,  via interpreter, sometimes got to [facility] with assistnace, has not ambulated        OT Problem List: Decreased activity tolerance;Pain;Decreased strength      OT Treatment/Interventions:      OT Goals(Current goals can be found in the care plan section) Acute Rehab OT Goals Patient Stated Goal: 'per daughter , " I don't know" OT Goal Formulation: With family Time For Goal Achievement: 07/31/20 Potential to Achieve Goals: Fair ADL Goals Pt Will Perform Grooming: sitting;with set-up (EOB) Pt Will Perform Upper Body Dressing: with set-up;sitting Pt Will Transfer to Toilet: with min guard assist;bedside commode;stand pivot transfer Pt Will Perform Toileting - Clothing Manipulation and hygiene: with supervision Additional ADL Goal #1: Pt will demonstrate good static and dynamic sitting balance at EOB in order to increase safety when engaging in seated ADLs.  OT Frequency: Min 2X/week   Barriers to D/C:    Daughter reports that  she is unsure at this time regarding ability to care for pt at home in current level of function and pain.       Co-evaluation PT/OT/SLP Co-Evaluation/Treatment: Yes Reason for Co-Treatment: Complexity of the patient's impairments (multi-system involvement);To address functional/ADL transfers PT goals addressed during session: Mobility/safety with mobility OT goals addressed during session: ADL's and self-care      AM-PAC OT "6 Clicks" Daily Activity     Outcome Measure Help from another person eating meals?: A Little Help from another person taking care of personal grooming?: A Lot Help from another person toileting, which includes using toliet, bedpan, or urinal?: Total Help from another person bathing (including washing, rinsing,  drying)?: A Lot Help from another person to put on and taking off regular upper body clothing?: A Lot Help from another person to put on and taking off regular lower body clothing?: Total 6 Click Score: 11   End of Session Nurse Communication: Other (comment) (Unable to mobilize EOB/OOB due to pain)  Activity Tolerance: Patient limited by pain;Patient limited by fatigue Patient left: in bed;with bed alarm set;with family/visitor present  OT Visit Diagnosis: Pain;Muscle weakness (generalized) (M62.81);Other abnormalities of gait and mobility (R26.89) Pain - Right/Left: Left Pain - part of body: Ankle and joints of foot (and RUE)                Time: 4166-0630 OT Time Calculation (min): 19 min Charges:  OT General Charges $OT Visit: 1 Visit OT Evaluation $OT Eval Low Complexity: 1 Low  Jasiri Hanawalt, OT Acute Rehab Services Office: [phone] 07/17/2020  Theodoro Clock 07/17/2020, 10:50 AM

## 2020-07-17 NOTE — Consult Note (Signed)
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  Wife has translator app on her phone and is very helpful.  She is concerned about the pain he experiences in left ankle and tenderness to right posterior elbow.  Wound type:inflammatory/gout exacerbation Pressure Injury POA: NA Measurement:Left lateral malleolus:  Tenderness and edema with peeling epithelium.  Scant serosanguinous drainage.  Right posterior open lesion:  1 cm x 0.5 cm with 100% fibrin to open wound Wound bed:see above Drainage (amount, consistency, odor) minimal serosanguinous  No odor.  Periwound:erythema and edema Dressing procedure/placement/frequency:Cleanse wounds to left ankle and right posterior elbow with NS.  Apply Xeroform gauze to draining area.  Cover with dry gauze and kerlix/tape. Change daily.  Will not follow at this time.  Please re-consult if needed.  Maple Hudson MSN, RN, FNP-BC CWON Wound, Ostomy, Continence Nurse Pager [phone]

## 2020-07-17 NOTE — Progress Notes (Signed)
  Echocardiogram 2D Echocardiogram has been performed.  Brian Potter 07/17/2020, 8:55 AM

## 2020-07-17 NOTE — Progress Notes (Signed)
Initial Nutrition Assessment  DOCUMENTATION CODES:   Underweight  INTERVENTION:   -Ensure Enlive po BID, each supplement provides 350 kcal and 20 grams of protein  -Juven Fruit Punch BID, each serving provides 95kcal and 2.5g of protein (amino acids glutamine and arginine)  NUTRITION DIAGNOSIS:   Increased nutrient needs related to wound healing as evidenced by estimated needs.  GOAL:   Patient will meet greater than or equal to 90% of their needs  MONITOR:   Supplement acceptance,PO intake,Labs,Weight trends,I & O's,Skin  REASON FOR ASSESSMENT:   Consult Assessment of nutrition requirement/status  ASSESSMENT:   87 y.o. male with a history of gout, diabetes mellitus. Patient presented secondary to weakness and ankle pain with concern for acute gout. He was recently treated for ankle cellulitis. He also had a right elbow wound. Empiric antibiotics initiated. MRI of left ankle suggests severe gout flare.  Pt non-english speaking, no translator available. Per chart review, pt with severe gout flare in ankle and pt with elbow wound.  Ordered Ensure and Juven supplements to aid in wound healing.   Per weight records, pt has lost 16 lbs since 2/22 (14% wt loss x 2 weeks, significant for time frame).  Medications: Lactated ringers  Labs reviewed: CBGs: 103-124 Low K  NUTRITION - FOCUSED PHYSICAL EXAM:  Deferred.  Diet Order:   Diet Order            Diet Carb Modified Fluid consistency: Thin; Room service appropriate? Yes  Diet effective now                 EDUCATION NEEDS:   No education needs have been identified at this time  Skin:  Skin Assessment: Skin Integrity Issues: Skin Integrity Issues:: Incisions Incisions: left ankle  Last BM:  3/6  Height:   Ht Readings from Last 1 Encounters:  07/15/20 5' (1.524 m)    Weight:   Wt Readings from Last 1 Encounters:  07/15/20 42.4 kg    BMI:  Body mass index is 18.26 kg/m.  Estimated Nutritional  Needs:   Kcal:  1350-1550  Protein:  60-70g  Fluid:  1.6L/day  Tilda Franco, MS, RD, LDN Inpatient Clinical Dietitian Contact information available via Amion

## 2020-07-17 NOTE — Evaluation (Signed)
Physical Therapy Evaluation Patient Details Name: Brian Potter MRN: [id number] DOB: 05/03/1929 Today's Date: 07/17/2020   History of Present Illness  87 y.o. male with past medical history significant for CKD stage IIIa presents to ED 07/15/20, with complaint of pain in the right elbow and left lower extremity. Recently in  [facility] w/ Dx of anemia possibly 2* GIB, DC 07/07/20. Imaging showed tophaceous gout L ankle and chronic R elbow fracture deformity.  Clinical Impression  Evaluation limited due to patient complaints of pain in right elbow and left foot with any minimal movements. Unable to assess mobility. Patient's daughter present and using Google interpreter as patient's dialect not availble on Ipad.  Patient DC'd home ~ 1 week ago with a WC. Daughter indicates patient gets into [facility] with assistance.  Pt admitted with above diagnosis.  Pt currently with functional limitations due to the deficits listed below (see PT Problem List). Pt will benefit from skilled PT to increase their independence and safety with mobility to allow discharge to the venue listed below.       Follow Up Recommendations  HHPT    Equipment Recommendations  None recommended by PT (has WC)    Recommendations for Other Services       Precautions / Restrictions Precautions Precautions: Fall Precaution Comments: significant pain left foot and right elbow, grmaces      Mobility  Bed Mobility Overal bed mobility: Needs Assistance             General bed mobility comments: attempted to mobilize, patient indicating lt foot pain and unable to procede with mobility. Repositioned lt leg and rt UE.. "I can't sit up".    Transfers                 General transfer comment: NT  Ambulation/Gait                Stairs            Wheelchair Mobility    Modified Rankin (Stroke Patients Only)       Balance Overall balance assessment:  (TBA)                                            Pertinent Vitals/Pain Faces Pain Scale: Hurts worst Pain Location: L ankle with movement, right elbow Pain Descriptors / Indicators: Grimacing;Guarding Pain Intervention(s): Limited activity within patient's tolerance;Monitored during session    Home Living Family/patient expects to be discharged to:: Private residence Living Arrangements: Spouse/significant other;Children;Other relatives Available Help at Discharge: Family;Available 24 hours/day   Home Access: Stairs to enter   Entrance Stairs-Number of Steps: 2   Home Equipment: Cane - single point;Wheelchair - manual      Prior Function           Comments: per daughter in room,  via interpreter, sometimes got to [facility] with assistnace, has not ambulated     Hand Dominance        Extremity/Trunk Assessment        Lower Extremity Assessment Lower Extremity Assessment: LLE deficits/detail;RLE deficits/detail RLE Deficits / Details: lifts leg from bed easily LLE Deficits / Details: limited assessment due to appaant pain of lt foot and rt elbow,    Cervical / Trunk Assessment Cervical / Trunk Assessment: Other exceptions Cervical / Trunk Exceptions: nt  Communication   Communication: Prefers language other than [place];Interpreter utilized (daughter  and  IPHONE interpretation)  Cognition Arousal/Alertness: Lethargic Behavior During Therapy: Flat affect Overall Cognitive Status: Difficult to assess                                 General Comments: pt. keeping eyes closed. minimal  responses to questions via interpreter by dtr.      General Comments      Exercises     Assessment/Plan    PT Assessment Patient needs continued PT services  PT Problem List Decreased mobility;Decreased range of motion;Decreased activity tolerance;Decreased balance;Decreased knowledge of use of DME       PT Treatment Interventions Gait training;Therapeutic activities;Therapeutic exercise;Functional mobility  training;Patient/family education    PT Goals (Current goals can be found in the Care Plan section)  Acute Rehab PT Goals Patient Stated Goal: 'per daughter , " I don't know" PT Goal Formulation: With family Time For Goal Achievement: 07/31/20 Potential to Achieve Goals: Fair    Frequency Min 2X/week   Barriers to discharge        Co-evaluation PT/OT/SLP Co-Evaluation/Treatment: Yes Reason for Co-Treatment: To address functional/ADL transfers;For patient/therapist safety;Complexity of the patient's impairments (multi-system involvement) PT goals addressed during session: Mobility/safety with mobility         AM-PAC PT "6 Clicks" Mobility  Outcome Measure Help needed turning from your back to your side while in a flat bed without using bedrails?: Total Help needed moving from lying on your back to sitting on the side of a flat bed without using bedrails?: Total Help needed moving to and from a bed to a chair (including a wheelchair)?: Total Help needed standing up from a chair using your arms (e.g., wheelchair or bedside chair)?: Total Help needed to walk in hospital room?: Total Help needed climbing 3-5 steps with a railing? : Total 6 Click Score: 6    End of Session   Activity Tolerance: Patient limited by pain Patient left: in bed;with call bell/phone within reach;with bed alarm set;with family/visitor present Nurse Communication: Mobility status PT Visit Diagnosis: Difficulty in walking, not elsewhere classified (R26.2);Pain Pain - Right/Left: Left Pain - part of body: Ankle and joints of foot    Time: 4656-8127 PT Time Calculation (min) (ACUTE ONLY): 19 min   Charges:   PT Evaluation $PT Eval Low Complexity: 1 Low          Blanchard Kelch PT Acute Rehabilitation Services Pager [phone] Office [phone]   Rada Hay 07/17/2020, 10:01 AM

## 2020-07-17 NOTE — Progress Notes (Signed)
PROGRESS NOTE    Brian Potter  UEA:[id number] DOB: Mar 17, 1929 DOA: 07/15/2020 PCP: Patient, No Pcp Per   Brief Narrative: Brian Potter is a 87 y.o. male with a history of gout, diabetes mellitus. Patient presented secondary to weakness and ankle pain with concern for acute gout. He was recently treated for ankle cellulitis. He also had a right elbow wound. Empiric antibiotics initiated. MRI of left ankle suggests severe gout flare.   Assessment & Plan:   Principal Problem:   Ankle wound, left, initial encounter Active Problems:   Chronic tophaceous gout of left foot   SIRS (systemic inflammatory response syndrome) (HCC)   Generalized weakness   Open wound of right elbow   Ankle pain This appears to be most consistent with severe acute gout. MRI confirms likely gout flare. Recently treated for cellulitis so this has been occurring for almost two weeks. Orthopedic surgery performed arthrocentesis without steroid injection secondary to concern for associated infection. Preliminary fluid analysis confirms urate crystals in addition to gram positive cocci on gram stain. Orthopedic surgery consulted for aspiration injection which was performed on 3/6 -Continue Tylenol/Morphine prn for pain; added Percocet prn -Follow-up aspirate fluid culture  SIRS Present on admission. No definitive infectious source identified yet. -Continue Vancomycin/Ceftriaxone -Discontinue Flagyl  Left elbow wound From fall. Per history, there appears to have been drainage. Concern would be for possible septic joint. Unable to obtain MRI on admission, unfortunately. X-ray without effusion, but does show subcutaneous gas. Orthopedic surgery without concern for active infection at this time. -Wound care recommendations: Cleanse wounds to left ankle and right posterior elbow with NS.  Apply Xeroform gauze to draining area.  Cover with dry gauze and kerlix/tape. Change daily.   Arrhythmia Concern for possible atrial  fibrillation/flutter on my evaluation. Associated rapid ventricular response. Started on metoprolol 50 mg BID with bradycardia. Transthoracic Echocardiogram without atrial pathology with some mild/moderate AR -Decrease to Metoprolol 25 mg BID -Cardiology consult -Hold Anticoagulation pending surgical evaluation/management  Generalized weakness -PT/OT evaluation  Diabetes mellitus, type 2 Hemoglobin A1C of 6.3%  Primary hypertension History but not on medication per chart review. Blood pressure uncontrolled.  -Continue losartan 50 mg daily in setting of diabetes diagnosis; will need outpatient BMP for follow-up  History of tophaceous gout Possibly having an acute gout flare. He is on allopurinol and colchicine as an outpatient  History of upper GI bleeding Noted   DVT prophylaxis: SCDs Code Status:   Code Status: DNR Family Communication: Family member at bedside Disposition Plan: Discharge likely home vs SNF in several days pending improvement of pain/treatment of gout flare and workup/management for possible septic arthritis   Consultants:   None  Procedures:   None  Antimicrobials:  Vancomycin  Ceftriaxone  Flagyl    Subjective:  Still with some pain in ankle and elbow but this is better. No other concerns.  Objective: Vitals:   07/16/20 2017 07/16/20 2115 07/17/20 0417 07/17/20 1327  BP: (!) 123/51  (!) 106/49 (!) 96/51  Pulse: 69 (!) 114 (!) 44 80  Resp: Temp: 98.2 F (36.8 C)  98.1 F (36.7 C) 98.5 F (36.9 C)  TempSrc: Oral  Oral Oral  SpO2: 96%  96% 97%  Weight:      Height:        Intake/Output Summary (Last 24 hours) at 07/17/2020 1335 Last data filed at 07/17/2020 0549 Gross per 24 hour  Intake 2351.94 ml  Output 500 ml  Net 1851.94 ml  Filed Weights   07/15/20 1558 07/15/20 2022  Weight: 50 kg 42.4 kg    Examination:  General exam: Appears calm and comfortable Respiratory system: Clear to auscultation. Respiratory  effort normal. Cardiovascular system: S1 & S2 heard, RRR. No murmurs, rubs, gallops or clicks. Gastrointestinal system: Abdomen is nondistended, soft and nontender. No organomegaly or masses felt. Normal bowel sounds heard. Central nervous system: Alert Musculoskeletal: No calf tenderness. Left ankle is still swollen with reduced erythema and some tenderness. Right elbow with open wound and no visible drainage Skin: No cyanosis. No rashes     Data Reviewed: I have personally reviewed following labs and imaging studies  CBC Lab Results  Component Value Date   WBC 8.5 07/17/2020   RBC 3.47 (L) 07/17/2020   HGB 10.0 (L) 07/17/2020   HCT 31.6 (L) 07/17/2020   MCV 91.1 07/17/2020   MCH 28.8 07/17/2020   PLT 225 07/17/2020   MCHC 31.6 07/17/2020   RDW 15.4 07/17/2020   LYMPHSABS 1.5 07/15/2020   MONOABS 1.7 (H) 07/15/2020   EOSABS 0.0 07/15/2020   BASOSABS 0.0 07/15/2020     Last metabolic panel Lab Results  Component Value Date   NA 135 07/17/2020   K 3.3 (L) 07/17/2020   CL 105 07/17/2020   CO2 19 (L) 07/17/2020   BUN 25 (H) 07/17/2020   CREATININE 1.09 07/17/2020   GLUCOSE 138 (H) 07/17/2020   GFRNONAA >60 07/17/2020   GFRAA >60 04/22/2019   CALCIUM 7.5 (L) 07/17/2020   PROT 4.8 (L) 07/16/2020   ALBUMIN 1.8 (L) 07/16/2020   BILITOT 0.7 07/16/2020   ALKPHOS 52 07/16/2020   AST 37 07/16/2020   ALT 16 07/16/2020   ANIONGAP 11 07/17/2020    CBG (last 3)  Recent Labs    07/16/20 2020 07/17/20 0748 07/17/20 1123  GLUCAP 84 124* 103*     GFR: Estimated Creatinine Clearance: 27.6 mL/min (by C-G formula based on SCr of 1.09 mg/dL).  Coagulation Profile: No results for input(s): INR, PROTIME in the last 168 hours.  Recent Results (from the past 240 hour(s))  SARS CORONAVIRUS 2 (TAT 6-24 HRS) Nasopharyngeal Nasopharyngeal Swab     Status: None   Collection Time: 07/15/20  5:25 PM   Specimen: Nasopharyngeal Swab  Result Value Ref Range Status   SARS  Coronavirus 2 NEGATIVE NEGATIVE Final    Comment: (NOTE) SARS-CoV-2 target nucleic acids are NOT DETECTED.  The SARS-CoV-2 RNA is generally detectable in upper and lower respiratory specimens during the acute phase of infection. Negative results do not preclude SARS-CoV-2 infection, do not rule out co-infections with other pathogens, and should not be used as the sole basis for treatment or other patient management decisions. Negative results must be combined with clinical observations, patient history, and epidemiological information. The expected result is Negative.  Fact Sheet for Patients: HairSlick.nohttps://www.fda.gov/media/138098/download  Fact Sheet for Healthcare Providers: quierodirigir.comhttps://www.fda.gov/media/138095/download  This test is not yet approved or cleared by the [place] FDA and  has been authorized for detection and/or diagnosis of SARS-CoV-2 by FDA under an Emergency Use Authorization (EUA). This EUA will remain  in effect (meaning this test can be used) for the duration of the COVID-19 declaration under Se ction 564(b)(1) of the Act, 21 U.S.C. section 360bbb-3(b)(1), unless the authorization is terminated or revoked sooner.  Performed at [facility], [address] [place], [place] [address]   Blood culture (routine x 2)     Status: None (Preliminary result)   Collection Time:  07/15/20  5:35 PM   Specimen: BLOOD LEFT ARM  Result Value Ref Range Status   Specimen Description   Final    BLOOD LEFT ARM Performed at [facility], [address], [place], [place] [address]    Special Requests   Final    BOTTLES DRAWN AEROBIC AND ANAEROBIC Blood Culture adequate volume Performed at [facility], [address] [place], [place] [address]    Culture   Final    NO GROWTH 1 DAY Performed at [facility], [address] [place], [place] [address]    Report Status PENDING  Incomplete  MRSA PCR Screening     Status: None    Collection Time: 07/15/20  9:39 PM   Specimen: Nasal Mucosa; Nasopharyngeal  Result Value Ref Range Status   MRSA by PCR NEGATIVE NEGATIVE Final    Comment:        The GeneXpert MRSA Assay (FDA approved for NASAL specimens only), is one component of a comprehensive MRSA colonization surveillance program. It is not intended to diagnose MRSA infection nor to guide or monitor treatment for MRSA infections. Performed at [facility], [address] [place], [place] [address]   Aerobic Culture w Gram Stain (superficial specimen)     Status: None (Preliminary result)   Collection Time: 07/16/20  3:16 PM   Specimen: Foot; Body Fluid  Result Value Ref Range Status   Specimen Description   Final    FOOT LEFT Performed at [facility], [address], [place], [place] [address]    Special Requests   Final    NONE Performed at [facility], [address] [place], [place] [address]    Gram Stain   Final    MODERATE WBC PRESENT,BOTH PMN AND MONONUCLEAR RARE GRAM POSITIVE COCCI IN PAIRS    Culture   Final    NO GROWTH < 24 HOURS Performed at [facility], [address] [place], [place] [address]    Report Status PENDING  Incomplete        Radiology Studies: MR ANKLE LEFT WO CONTRAST  Result Date: 07/16/2020 CLINICAL DATA:  Diabetic patient with pain, redness and swelling of the left ankle. History of gout. EXAM: MRI OF THE LEFT ANKLE WITHOUT CONTRAST TECHNIQUE: Multiplanar, multisequence MR imaging of the left ankle was performed. No intravenous contrast was administered. COMPARISON:  Plain films left foot 07/04/2020. FINDINGS: Bones/Joint/Cartilage Marrow edema and erosions are seen in both the medial and lateral malleoli, calcaneus and visualized bones of the midfoot. Edema is most extensive in the calcaneus and erosive change is worst about the subtalar joint. No fracture. 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 Otherwise negative. Muscles and Tendons There is a large volume of fluid in the sheaths of the peroneal and posteromedial tendons with extensive synovial thickening and likely calcifications. There is also a large volume of fluid in the extensor digitorum tendons. No tear is identified. Soft tissues Soft tissue edema is present about the foot. There is a septated fluid collection along the lateral calcaneus measuring approximately 4 cm craniocaudal by 7 cm AP x 2 cm transverse. Calcifications in the soft tissues and fluid collections are better visualized on the prior plain films. IMPRESSION: Findings as described above are highly favored represent severe gouty arthropathy rather than osteomyelitis, septic tenosynovitis and abscess. The septated fluid collection along the lateral calcaneus is immediately be the skin  surface and should be easily amenable to bedside aspiration which could assist in definitive diagnosis. Electronically Signed   By: Drusilla Kanner M.D.   On: 07/16/2020 09:19   DG Chest Portable 1 View  Result Date: 07/15/2020 CLINICAL DATA:  Sepsis.  Generalized weakness. EXAM: PORTABLE CHEST 1 VIEW COMPARISON:  None. FINDINGS: Low lung volumes. The heart is enlarged. Aortic atherosclerosis and tortuosity. No confluent consolidation. No large pleural effusion. No pneumothorax. No acute osseous abnormalities are seen. IMPRESSION: Low lung volumes with cardiomegaly. No evidence of congestive failure or acute pulmonary process. Electronically Signed   By: Narda Rutherford M.D.   On: 07/15/2020 19:21   ECHOCARDIOGRAM COMPLETE  Result Date: 07/17/2020    ECHOCARDIOGRAM REPORT   Patient Name:   Brian Potter   Date of Exam: 07/17/2020 Medical Rec #:  [id number]  Height:       60.0 in Accession #:    [id number] Weight:       93.5 lb Date of Birth:  02/08/1929  BSA:          1.351 m Patient Age:    89 years   BP:           106/49 mmHg Patient Gender: M           HR:           85 bpm. Exam Location:  Inpatient Procedure: 2D Echo Indications:    Atrial Flutter I48.92  History:        Patient has no prior history of Echocardiogram examinations.                 Risk Factors:Hypertension and Diabetes.  Sonographer:    Thurman Coyer RDCS (AE) Referring Phys: [phone] Samuel Mcpeek A Semaje Kinker IMPRESSIONS  1. Left ventricular ejection fraction, by estimation, is 60 to 65%. The left ventricle has normal function. The left ventricle has no regional wall motion abnormalities. Left ventricular diastolic parameters are indeterminate due to atrial fibrillation.  2. Right ventricular systolic function is normal. The right ventricular size is normal. There is normal pulmonary artery systolic pressure.  3. The mitral valve is normal in structure. Trivial mitral valve regurgitation.  4. The aortic valve is tricuspid. There is mild calcification of the aortic valve. There is mild thickening of the aortic valve. Aortic valve regurgitation is mild to moderate.  5. The inferior vena cava is normal in size with greater than 50% respiratory variability, suggesting right atrial pressure of 3 mmHg. Comparison(s): No prior Echocardiogram. FINDINGS  Left Ventricle: Left ventricular ejection fraction, by estimation, is 60 to 65%. The left ventricle has normal function. The left ventricle has no regional wall motion abnormalities. The left ventricular internal cavity size was normal in size. There is  no left ventricular hypertrophy. Left ventricular diastolic parameters are indeterminate. Right Ventricle: The right ventricular size is normal. No increase in right ventricular wall thickness. Right ventricular systolic function is normal. There is normal pulmonary artery systolic pressure. The tricuspid regurgitant velocity is 2.81 m/s, and  with an assumed right atrial pressure of 3 mmHg, the estimated right ventricular systolic pressure is 34.6 mmHg. Left Atrium: Left atrial size was normal in size. Right  Atrium: Right atrial size was normal in size. Pericardium: There is no evidence of pericardial effusion. Mitral Valve: The mitral valve is normal in structure. There is mild thickening of the mitral valve leaflet(s). Trivial mitral valve regurgitation. Tricuspid Valve: The tricuspid valve is normal in structure. Tricuspid valve regurgitation is mild. Aortic  Valve: The aortic valve is tricuspid. There is mild calcification of the aortic valve. There is mild thickening of the aortic valve. Aortic valve regurgitation is mild to moderate. Aortic regurgitation PHT measures 391 msec. Pulmonic Valve: The pulmonic valve was normal in structure. Pulmonic valve regurgitation is trivial. Aorta: The aortic root and ascending aorta are structurally normal, with no evidence of dilitation. Venous: The inferior vena cava is normal in size with greater than 50% respiratory variability, suggesting right atrial pressure of 3 mmHg. IAS/Shunts: No atrial level shunt detected by color flow Doppler.  LEFT VENTRICLE PLAX 2D LVIDd:         4.60 cm LVIDs:         2.40 cm LV PW:         0.80 cm LV IVS:        0.90 cm LVOT diam:     2.10 cm LVOT Area:     3.46 cm  RIGHT VENTRICLE TAPSE (M-mode): 1.6 cm LEFT ATRIUM             Index       RIGHT ATRIUM          Index LA diam:        3.30 cm 2.44 cm/m  RA Area:     8.07 cm LA Vol (A2C):   32.2 ml 23.83 ml/m RA Volume:   12.60 ml 9.33 ml/m LA Vol (A4C):   31.9 ml 23.61 ml/m LA Biplane Vol: 33.2 ml 24.57 ml/m  AORTIC VALVE AI PHT:      391 msec  AORTA Ao Root diam: 3.10 cm TRICUSPID VALVE TR Peak grad:   31.6 mmHg TR Vmax:        281.00 cm/s  SHUNTS Systemic Diam: 2.10 cm Laurance Flatten MD Electronically signed by Laurance Flatten MD Signature Date/Time: 07/17/2020/12:32:14 PM    Final         Scheduled Meds: . colchicine  0.6 mg Oral Daily  . dexamethasone (DECADRON) injection  8 mg Intra-articular Once  . feeding supplement  237 mL Oral BID BM  . insulin aspart  0-9 Units  Subcutaneous TID WC  . losartan  50 mg Oral Daily  . metoprolol tartrate  25 mg Oral BID  . metroNIDAZOLE  500 mg Oral Q8H  . nutrition supplement (JUVEN)  1 packet Oral BID WC   Continuous Infusions: . cefTRIAXone (ROCEPHIN)  IV 2 g (07/17/20 1004)  . lactated ringers 75 mL/hr at 07/16/20 2122  . vancomycin 500 mg (07/16/20 2116)     LOS: 1 day     Jacquelin Hawking, MD [facility] 07/17/2020, 1:35 PM  If 7PM-7AM, please contact night-coverage [email or web address]

## 2020-07-17 NOTE — Consult Note (Addendum)
Cardiology Consultation:   Patient ID: Brian Potter MRN: [id number]; DOB: [birth year]  Admit date: 07/15/2020 Date of Consult: 07/17/2020  PCP:  Patient, No Pcp Per   [facility]  Cardiologist:  Chilton Si, MD (new)  Patient Profile:   Brian Potter is a 87 y.o. male with a hx of diabetes, hypertension, CKD IIIa, and gout who is being seen today for the evaluation of atrial fibrillation with RVR at the request of Dr. Caleb Popp.  History of Present Illness:   History was difficult to obtain.  His daughter was at the bedside and assisted with translation from the [place] interpreter to [facility] for Mr. Brian Potter.  Mr. Brian Potter was admitted with ankle pain and weakness concerning for gout.  He was recently treated for cellulitis of the ankle.  He was empirically started on antibiotics but MRI was suggestive of a gout flare.  He is followed by orthopedics and underwent arthrocentesis which revealed both urate crystals and gram-positive cocci.  Cardiology was called due to concern for atrial fibrillation with rapid ventricular response.  He was started on metoprolol and became bradycardic so the dose was reduced to 25 mg.  He has no other cardiac history other than hypertension.  An echocardiogram was performed and revealed LVEF 60 to 65% with mild to moderate aortic regurgitation.  Labs are notable for hypokalemia and anemia.  Hemoglobin was 6.5 12 days ago.  He had a positive FOBT but no frank melena or hematochezia.  No family decided against invasive work-up and he was transfused 2 units.  He was started on twice daily pantoprazole.  He denies any chest pain or recent palpitations.  He has experienced palpitations intermittently in the past.  He has shortness of breath in the past but not now.  He has pain in his L foot but none in the right.  He denies orthopnea or PND.  Past Medical History:  Diagnosis Date  . Diabetes mellitus without complication (HCC)   . Gout   . Hypertension      Past Surgical History:  Procedure Laterality Date  . HAND SURGERY       Home Medications:  Prior to Admission medications   Medication Sig Start Date End Date Taking? Authorizing Provider  allopurinol (ZYLOPRIM) 100 MG tablet Take 1 tablet (100 mg total) by mouth daily. 07/07/20 08/06/20 Yes Jerald Kief, MD  colchicine 0.6 MG tablet Take 1 tablet (0.6 mg total) by mouth 2 (two) times daily. 07/07/20 08/06/20 Yes Jerald Kief, MD  pantoprazole (PROTONIX) 40 MG tablet Take 1 tablet (40 mg total) by mouth 2 (two) times daily. 07/07/20 09/05/20 Yes Jerald Kief, MD  predniSONE (DELTASONE) 10 MG tablet Take 6 tablets (60 mg total) by mouth daily with breakfast for 2 days, THEN 4 tablets (40 mg total) daily with breakfast for 2 days, THEN 2 tablets (20 mg total) daily with breakfast for 2 days, THEN 1 tablet (10 mg total) daily with breakfast for 2 days, THEN 0.5 tablets (5 mg total) daily with breakfast for 2 days. 07/07/20 07/17/20 Yes Jerald Kief, MD  traMADol (ULTRAM) 50 MG tablet Take 50 mg by mouth every 6 (six) hours as needed for moderate pain. 07/11/20  Yes [provider]    Inpatient Medications: Scheduled Meds: . colchicine  0.6 mg Oral Daily  . dexamethasone (DECADRON) injection  8 mg Intra-articular Once  . feeding supplement  237 mL Oral BID BM  . insulin aspart  0-9 Units Subcutaneous  TID WC  . losartan  50 mg Oral Daily  . metoprolol tartrate  25 mg Oral BID  . nutrition supplement (JUVEN)  1 packet Oral BID WC   Continuous Infusions: . cefTRIAXone (ROCEPHIN)  IV 2 g (07/17/20 1004)  . lactated ringers 75 mL/hr at 07/16/20 2122  . vancomycin 500 mg (07/16/20 2116)   PRN Meds: acetaminophen **OR** acetaminophen, morphine injection, ondansetron **OR** ondansetron (ZOFRAN) IV, oxyCODONE-acetaminophen  Allergies:   No Known Allergies  Social History:   Social History   Socioeconomic History  . Marital status: Unknown    Spouse name: Not on file  .  Number of children: Not on file  . Years of education: Not on file  . Highest education level: Not on file  Occupational History  . Not on file  Tobacco Use  . Smoking status: Former Smoker    Types: Pipe    Quit date: 07/04/2009    Years since quitting: 11.0  . Smokeless tobacco: Never Used  Vaping Use  . Vaping Use: Never used  Substance and Sexual Activity  . Alcohol use: Yes    Comment: 1 bottle most days  . Drug use: Never  . Sexual activity: Not Currently  Other Topics Concern  . Not on file  Social History Narrative  . Not on file   Social Determinants of Health   Financial Resource Strain: Not on file  Food Insecurity: Not on file  Transportation Needs: Not on file  Physical Activity: Not on file  Stress: Not on file  Social Connections: Not on file  Intimate Partner Violence: Not on file    Family History:   History reviewed. No pertinent family history.   ROS:  Please see the history of present illness.   All other ROS reviewed and negative.     Physical Exam/Data:   Vitals:   07/16/20 2017 07/16/20 2115 07/17/20 0417 07/17/20 1327  BP: (!) 123/51  (!) 106/49 (!) 96/51  Pulse: 69 (!) 114 (!) 44 80  Resp: Temp: 98.2 F (36.8 C)  98.1 F (36.7 C) 98.5 F (36.9 C)  TempSrc: Oral  Oral Oral  SpO2: 96%  96% 97%  Weight:      Height:        Intake/Output Summary (Last 24 hours) at 07/17/2020 1617 Last data filed at 07/17/2020 0549 Gross per 24 hour  Intake 2251.94 ml  Output 500 ml  Net 1751.94 ml   Last 3 Weights 07/15/2020 07/15/2020 07/04/2020  Weight (lbs) 93 lb 7.6 oz 110 lb 3.7 oz 110 lb  Weight (kg) 42.4 kg 50 kg 49.896 kg   VS:  BP (!) 96/51   Pulse 80   Temp 98.5 F (36.9 C) (Oral)   Resp 16   Ht 5' (1.524 m)   Wt 42.4 kg   SpO2 97%   BMI 18.26 kg/m  , BMI Body mass index is 18.26 kg/m. GENERAL:  Frail.  Well-appearing HEENT: Pupils equal round and reactive, fundi not visualized, oral mucosa unremarkable NECK:  No jugular  venous distention, waveform within normal limits, carotid upstroke brisk and symmetric, no bruits LUNGS:  Clear to auscultation bilaterally HEART:  Tachycardic.  Irregularly irregular.  PMI not displaced or sustained,S1 and S2 within normal limits, no S3, no S4, no clicks, no rubs, no murmurs ABD:  Flat, positive bowel sounds normal in frequency in pitch, no bruits, no rebound, no guarding, no midline pulsatile mass, no hepatomegaly, no splenomegaly EXT:  2 plus pulses throughout, no edema, no cyanosis no clubbing SKIN:  No rashes no nodules NEURO:  Cranial nerves II through XII grossly intact, motor grossly intact throughout PSYCH:  Cognitively intact, oriented to person place and time   EKG:  The EKG was personally reviewed and demonstrates:  Atrial fibrillation.  Rate 123 bpm.  Low voltage.  RBBB.  Telemetry:  Telemetry was personally reviewed and demonstrates:  Atrial fibrillation with RVR.    Relevant CV Studies:  Echo 07/17/20: 1. Left ventricular ejection fraction, by estimation, is 60 to 65%. The  left ventricle has normal function. The left ventricle has no regional  wall motion abnormalities. Left ventricular diastolic parameters are  indeterminate due to atrial fibrillation.  2. Right ventricular systolic function is normal. The right ventricular  size is normal. There is normal pulmonary artery systolic pressure.  3. The mitral valve is normal in structure. Trivial mitral valve  regurgitation.  4. The aortic valve is tricuspid. There is mild calcification of the  aortic valve. There is mild thickening of the aortic valve. Aortic valve  regurgitation is mild to moderate.  5. The inferior vena cava is normal in size with greater than 50%  respiratory variability, suggesting right atrial pressure of 3 mmHg.   Laboratory Data:  High Sensitivity Troponin:  No results for input(s): TROPONINIHS in the last 720 hours.   Chemistry Recent Labs  Lab 07/15/20 1701  07/16/20 0332 07/17/20 0852  NA 135 133* 135  K 4.1 3.4* 3.3*  CL 103 104 105  CO2 23 20* 19*  GLUCOSE 104* 78 138*  BUN 29* 24* 25*  CREATININE 1.18 0.96 1.09  CALCIUM 8.4* 7.4* 7.5*  GFRNONAA 59* >60 >60  ANIONGAP 9 9 11     Recent Labs  Lab 07/15/20 1701 07/16/20 0332  PROT 7.4 4.8*  ALBUMIN 2.8* 1.8*  AST 50* 37  ALT 24 16  ALKPHOS 81 52  BILITOT 0.5 0.7   Hematology Recent Labs  Lab 07/15/20 1701 07/16/20 0332 07/17/20 0852  WBC 14.7* 12.0* 8.5  RBC 4.54 3.49* 3.47*  HGB 12.9* 9.8* 10.0*  HCT 40.5 31.5* 31.6*  MCV 89.2 90.3 91.1  MCH 28.4 28.1 28.8  MCHC 31.9 31.1 31.6  RDW 15.3 15.4 15.4  PLT 394 267 225   BNPNo results for input(s): BNP, PROBNP in the last 168 hours.  DDimer No results for input(s): DDIMER in the last 168 hours.   Radiology/Studies:  MR ANKLE LEFT WO CONTRAST  Result Date: 07/16/2020 CLINICAL DATA:  Diabetic patient with pain, redness and swelling of the left ankle. History of gout. EXAM: MRI OF THE LEFT ANKLE WITHOUT CONTRAST TECHNIQUE: Multiplanar, multisequence MR imaging of the left ankle was performed. No intravenous contrast was administered. COMPARISON:  Plain films left foot 07/04/2020. FINDINGS: Bones/Joint/Cartilage Marrow edema and erosions are seen in both the medial and lateral malleoli, calcaneus and visualized bones of the midfoot. Edema is most extensive in the calcaneus and erosive change is worst about the subtalar joint. No fracture. 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 Otherwise negative. Muscles and Tendons There is a large volume of fluid in the sheaths of the peroneal and posteromedial tendons with extensive synovial thickening and likely calcifications. There is also a large volume of fluid in the extensor digitorum tendons. No tear is identified. Soft tissues Soft tissue edema is present about the foot. There is a septated fluid collection along the lateral calcaneus  measuring approximately 4 cm craniocaudal  by 7 cm AP x 2 cm transverse. Calcifications in the soft tissues and fluid collections are better visualized on the prior plain films. IMPRESSION: Findings as described above are highly favored represent severe gouty arthropathy rather than osteomyelitis, septic tenosynovitis and abscess. The septated fluid collection along the lateral calcaneus is immediately be the skin surface and should be easily amenable to bedside aspiration which could assist in definitive diagnosis. Electronically Signed   By: Drusilla Kanner M.D.   On: 07/16/2020 09:19   DG Chest Portable 1 View  Result Date: 07/15/2020 CLINICAL DATA:  Sepsis.  Generalized weakness. EXAM: PORTABLE CHEST 1 VIEW COMPARISON:  None. FINDINGS: Low lung volumes. The heart is enlarged. Aortic atherosclerosis and tortuosity. No confluent consolidation. No large pleural effusion. No pneumothorax. No acute osseous abnormalities are seen. IMPRESSION: Low lung volumes with cardiomegaly. No evidence of congestive failure or acute pulmonary process. Electronically Signed   By: Narda Rutherford M.D.   On: 07/15/2020 19:21   ECHOCARDIOGRAM COMPLETE  Result Date: 07/17/2020    ECHOCARDIOGRAM REPORT   Patient Name:   EPIMENIO Janczak   Date of Exam: 07/17/2020 Medical Rec #:  [id number]  Height:       60.0 in Accession #:    [id number] Weight:       93.5 lb Date of Birth:  1928/05/24  BSA:          1.351 m Patient Age:    89 years   BP:           106/49 mmHg Patient Gender: M          HR:           85 bpm. Exam Location:  Inpatient Procedure: 2D Echo Indications:    Atrial Flutter I48.92  History:        Patient has no prior history of Echocardiogram examinations.                 Risk Factors:Hypertension and Diabetes.  Sonographer:    Thurman Coyer RDCS (AE) Referring Phys: [phone] RALPH A NETTEY IMPRESSIONS  1. Left ventricular ejection fraction, by estimation, is 60 to 65%. The left ventricle has normal function. The left ventricle  has no regional wall motion abnormalities. Left ventricular diastolic parameters are indeterminate due to atrial fibrillation.  2. Right ventricular systolic function is normal. The right ventricular size is normal. There is normal pulmonary artery systolic pressure.  3. The mitral valve is normal in structure. Trivial mitral valve regurgitation.  4. The aortic valve is tricuspid. There is mild calcification of the aortic valve. There is mild thickening of the aortic valve. Aortic valve regurgitation is mild to moderate.  5. The inferior vena cava is normal in size with greater than 50% respiratory variability, suggesting right atrial pressure of 3 mmHg. Comparison(s): No prior Echocardiogram. FINDINGS  Left Ventricle: Left ventricular ejection fraction, by estimation, is 60 to 65%. The left ventricle has normal function. The left ventricle has no regional wall motion abnormalities. The left ventricular internal cavity size was normal in size. There is  no left ventricular hypertrophy. Left ventricular diastolic parameters are indeterminate. Right Ventricle: The right ventricular size is normal. No increase in right ventricular wall thickness. Right ventricular systolic function is normal. There is normal pulmonary artery systolic pressure. The tricuspid regurgitant velocity is 2.81 m/s, and  with an assumed right atrial pressure of 3 mmHg, the estimated right ventricular systolic pressure is 34.6 mmHg. Left Atrium: Left atrial size was normal in  size. Right Atrium: Right atrial size was normal in size. Pericardium: There is no evidence of pericardial effusion. Mitral Valve: The mitral valve is normal in structure. There is mild thickening of the mitral valve leaflet(s). Trivial mitral valve regurgitation. Tricuspid Valve: The tricuspid valve is normal in structure. Tricuspid valve regurgitation is mild. Aortic Valve: The aortic valve is tricuspid. There is mild calcification of the aortic valve. There is mild  thickening of the aortic valve. Aortic valve regurgitation is mild to moderate. Aortic regurgitation PHT measures 391 msec. Pulmonic Valve: The pulmonic valve was normal in structure. Pulmonic valve regurgitation is trivial. Aorta: The aortic root and ascending aorta are structurally normal, with no evidence of dilitation. Venous: The inferior vena cava is normal in size with greater than 50% respiratory variability, suggesting right atrial pressure of 3 mmHg. IAS/Shunts: No atrial level shunt detected by color flow Doppler.  LEFT VENTRICLE PLAX 2D LVIDd:         4.60 cm LVIDs:         2.40 cm LV PW:         0.80 cm LV IVS:        0.90 cm LVOT diam:     2.10 cm LVOT Area:     3.46 cm  RIGHT VENTRICLE TAPSE (M-mode): 1.6 cm LEFT ATRIUM             Index       RIGHT ATRIUM          Index LA diam:        3.30 cm 2.44 cm/m  RA Area:     8.07 cm LA Vol (A2C):   32.2 ml 23.83 ml/m RA Volume:   12.60 ml 9.33 ml/m LA Vol (A4C):   31.9 ml 23.61 ml/m LA Biplane Vol: 33.2 ml 24.57 ml/m  AORTIC VALVE AI PHT:      391 msec  AORTA Ao Root diam: 3.10 cm TRICUSPID VALVE TR Peak grad:   31.6 mmHg TR Vmax:        281.00 cm/s  SHUNTS Systemic Diam: 2.10 cm Laurance Flatten MD Electronically signed by Laurance Flatten MD Signature Date/Time: 07/17/2020/12:32:14 PM    Final      Assessment and Plan:   # Atrial fibrillation: # Hypertension: Mr. Keil is in and out of atrial fibrillation.  Rates are poorly controlled.  He also had bradycardia after receiving metoprolol 50mg .  He is intermittantly in sinus rhythm with frequent PVCs and ventricular bigeminy.  BP is low.  I had a long discussion with his daughter at the bedside and his niece over the phone.  We weighed the risks/benefits of anticoagulation and stroke.  Given his GI bleed and no plans for invasive procedures, we will not anticoagulate.  They understand that there is a 3% annual stroke risk.  We will focus on rate control.  Increase metoprolol to 37.5mg  bid.   Start amiodarone 200mg  bidx14 weeks then 200 mg daily.  He is already going in and out of atrial fibrillation, so starting amiodarone is unlikely to increase his risk of cardioversion and may help him maintain sinus rhythm. Thyroid and systolic function are normal. Maintain K>4 and Mg>2.  Given his labile BP will hold losartan for now.   CHA2DS2-VASc Score = 3  This indicates a 3.2% annual risk of stroke. The patient's score is based upon: CHF History: No HTN History: Yes Diabetes History: No Stroke History: No Vascular Disease History: No Age Score: 2 Gender Score: 0  Time spent: 70 minutes-Greater than 50% of this time was spent in counseling, explanation of diagnosis, planning of further management, and coordination of care.    Risk Assessment/Risk Scores:          CHA2DS2-VASc Score = 3  This indicates a 3.2% annual risk of stroke. The patient's score is based upon: CHF History: No HTN History: Yes Diabetes History: No Stroke History: No Vascular Disease History: No Age Score: 2 Gender Score: 0         For questions or updates, please contact [facility] Please consult [email or web address] for contact info under    Signed, Chilton Siiffany Platea, MD  07/17/2020 4:17 PM

## 2020-07-18 ENCOUNTER — Encounter (HOSPITAL_COMMUNITY): Payer: Self-pay | Admitting: Internal Medicine

## 2020-07-18 ENCOUNTER — Encounter (HOSPITAL_COMMUNITY): Payer: Medicare Other

## 2020-07-18 DIAGNOSIS — I10 Essential (primary) hypertension: Secondary | ICD-10-CM

## 2020-07-18 DIAGNOSIS — I4891 Unspecified atrial fibrillation: Secondary | ICD-10-CM

## 2020-07-18 LAB — GLUCOSE, CAPILLARY
Glucose-Capillary: 77 mg/dL (ref 70–99)
Glucose-Capillary: 113 mg/dL — ABNORMAL HIGH (ref 70–99)
Glucose-Capillary: 134 mg/dL — ABNORMAL HIGH (ref 70–99)
Glucose-Capillary: 116 mg/dL — ABNORMAL HIGH (ref 70–99)

## 2020-07-18 MED ORDER — SODIUM CHLORIDE 0.9 % IV BOLUS
500.0000 mL | Freq: Once | INTRAVENOUS | Status: AC
  Administered 2020-07-18: 500 mL via INTRAVENOUS

## 2020-07-18 NOTE — Progress Notes (Signed)
Progress Note  Patient Name: Brian Potter Date of Encounter: 07/18/2020  [facility] Cardiologist: Chilton Si, MD   Subjective   Feels well.  Foot pain is improving.  Denies CP, palpitations or shortness of breath.   Inpatient Medications    Scheduled Meds: . amiodarone  200 mg Oral BID  . colchicine  0.6 mg Oral Daily  . dexamethasone (DECADRON) injection  8 mg Intra-articular Once  . feeding supplement  237 mL Oral BID BM  . insulin aspart  0-9 Units Subcutaneous TID WC  . metoprolol tartrate  37.5 mg Oral BID  . nutrition supplement (JUVEN)  1 packet Oral BID WC   Continuous Infusions: . cefTRIAXone (ROCEPHIN)  IV 2 g (07/18/20 1033)  . lactated ringers 75 mL/hr at 07/18/20 1039  . vancomycin Stopped (07/17/20 2220)   PRN Meds: acetaminophen **OR** acetaminophen, morphine injection, ondansetron **OR** ondansetron (ZOFRAN) IV, oxyCODONE-acetaminophen   Vital Signs    Vitals:   07/18/20 0724 07/18/20 1027 07/18/20 1126 07/18/20 1514  BP:  (!) 143/84 106/61 123/73  Pulse: 93 89 83 73  Resp:   14 14  Temp:   99.1 F (37.3 C) 98.8 F (37.1 C)  TempSrc:   Oral Oral  SpO2: 94% 97% 93% 94%  Weight:      Height:        Intake/Output Summary (Last 24 hours) at 07/18/2020 1538 Last data filed at 07/18/2020 0400 Gross per 24 hour  Intake 2363.36 ml  Output -  Net 2363.36 ml   Last 3 Weights 07/15/2020 07/15/2020 07/04/2020  Weight (lbs) 93 lb 7.6 oz 110 lb 3.7 oz 110 lb  Weight (kg) 42.4 kg 50 kg 49.896 kg      Telemetry    Atrial fibrillation -->sinis rhythm with PVCs - Personally Reviewed  ECG    n/a - Personally Reviewed  Physical Exam   VS:  BP 123/73 (BP Location: Left Arm)   Pulse 73   Temp 98.8 F (37.1 C) (Oral)   Resp 14   Ht 5' (1.524 m)   Wt 42.4 kg   SpO2 94%   BMI 18.26 kg/m  , BMI Body mass index is 18.26 kg/m. GENERAL:Frail.  Well appearing HEENT: Pupils equal round and reactive, fundi not visualized, oral mucosa unremarkable NECK:   No jugular venous distention, waveform within normal limits, carotid upstroke brisk and symmetric, no bruits LUNGS:  Clear to auscultation bilaterally HEART:  Regular with occasional ectopy.  PMI not displaced or sustained,S1 and S2 within normal limits, no S3, no S4, no clicks, no rubs, no murmurs ABD:  Flat, positive bowel sounds normal in frequency in pitch, no bruits, no rebound, no guarding, no midline pulsatile mass, no hepatomegaly, no splenomegaly EXT:  2 plus pulses throughout, no edema, no cyanosis no clubbing SKIN:  No rashes no nodules NEURO:  Cranial nerves II through XII grossly intact, motor grossly intact throughout PSYCH:  Cognitively intact, oriented to person place and time   Labs    High Sensitivity Troponin:  No results for input(s): TROPONINIHS in the last 720 hours.    Chemistry Recent Labs  Lab 07/15/20 1701 07/16/20 0332 07/17/20 0852  NA 135 133* 135  K 4.1 3.4* 3.3*  CL 103 104 105  CO2 23 20* 19*  GLUCOSE 104* 78 138*  BUN 29* 24* 25*  CREATININE 1.18 0.96 1.09  CALCIUM 8.4* 7.4* 7.5*  PROT 7.4 4.8*  --   ALBUMIN 2.8* 1.8*  --   AST 50*  37  --   ALT 24 16  --   ALKPHOS 81 52  --   BILITOT 0.5 0.7  --   GFRNONAA 59* >60 >60  ANIONGAP 9 9 11      Hematology Recent Labs  Lab 07/15/20 1701 07/16/20 0332 07/17/20 0852  WBC 14.7* 12.0* 8.5  RBC 4.54 3.49* 3.47*  HGB 12.9* 9.8* 10.0*  HCT 40.5 31.5* 31.6*  MCV 89.2 90.3 91.1  MCH 28.4 28.1 28.8  MCHC 31.9 31.1 31.6  RDW 15.3 15.4 15.4  PLT 394 267 225    BNPNo results for input(s): BNP, PROBNP in the last 168 hours.   DDimer No results for input(s): DDIMER in the last 168 hours.   Radiology    ECHOCARDIOGRAM COMPLETE  Result Date: 07/17/2020    ECHOCARDIOGRAM REPORT   Patient Name:   RONY Yarborough   Date of Exam: 07/17/2020 Medical Rec #:  09/16/2020  Height:       60.0 in Accession #:    [id number] Weight:       93.5 lb Date of Birth:  1928-12-19  BSA:          1.351 m Patient Age:    87  years   BP:           106/49 mmHg Patient Gender: M          HR:           85 bpm. Exam Location:  Inpatient Procedure: 2D Echo Indications:    Atrial Flutter I48.92  History:        Patient has no prior history of Echocardiogram examinations.                 Risk Factors:Hypertension and Diabetes.  Sonographer:    13/05/1928 RDCS (AE) Referring Phys: [phone] RALPH A NETTEY IMPRESSIONS  1. Left ventricular ejection fraction, by estimation, is 60 to 65%. The left ventricle has normal function. The left ventricle has no regional wall motion abnormalities. Left ventricular diastolic parameters are indeterminate due to atrial fibrillation.  2. Right ventricular systolic function is normal. The right ventricular size is normal. There is normal pulmonary artery systolic pressure.  3. The mitral valve is normal in structure. Trivial mitral valve regurgitation.  4. The aortic valve is tricuspid. There is mild calcification of the aortic valve. There is mild thickening of the aortic valve. Aortic valve regurgitation is mild to moderate.  5. The inferior vena cava is normal in size with greater than 50% respiratory variability, suggesting right atrial pressure of 3 mmHg. Comparison(s): No prior Echocardiogram. FINDINGS  Left Ventricle: Left ventricular ejection fraction, by estimation, is 60 to 65%. The left ventricle has normal function. The left ventricle has no regional wall motion abnormalities. The left ventricular internal cavity size was normal in size. There is  no left ventricular hypertrophy. Left ventricular diastolic parameters are indeterminate. Right Ventricle: The right ventricular size is normal. No increase in right ventricular wall thickness. Right ventricular systolic function is normal. There is normal pulmonary artery systolic pressure. The tricuspid regurgitant velocity is 2.81 m/s, and  with an assumed right atrial pressure of 3 mmHg, the estimated right ventricular systolic pressure is 34.6 mmHg.  Left Atrium: Left atrial size was normal in size. Right Atrium: Right atrial size was normal in size. Pericardium: There is no evidence of pericardial effusion. Mitral Valve: The mitral valve is normal in structure. There is mild thickening of the mitral valve leaflet(s). Trivial mitral valve regurgitation.  Tricuspid Valve: The tricuspid valve is normal in structure. Tricuspid valve regurgitation is mild. Aortic Valve: The aortic valve is tricuspid. There is mild calcification of the aortic valve. There is mild thickening of the aortic valve. Aortic valve regurgitation is mild to moderate. Aortic regurgitation PHT measures 391 msec. Pulmonic Valve: The pulmonic valve was normal in structure. Pulmonic valve regurgitation is trivial. Aorta: The aortic root and ascending aorta are structurally normal, with no evidence of dilitation. Venous: The inferior vena cava is normal in size with greater than 50% respiratory variability, suggesting right atrial pressure of 3 mmHg. IAS/Shunts: No atrial level shunt detected by color flow Doppler.  LEFT VENTRICLE PLAX 2D LVIDd:         4.60 cm LVIDs:         2.40 cm LV PW:         0.80 cm LV IVS:        0.90 cm LVOT diam:     2.10 cm LVOT Area:     3.46 cm  RIGHT VENTRICLE TAPSE (M-mode): 1.6 cm LEFT ATRIUM             Index       RIGHT ATRIUM          Index LA diam:        3.30 cm 2.44 cm/m  RA Area:     8.07 cm LA Vol (A2C):   32.2 ml 23.83 ml/m RA Volume:   12.60 ml 9.33 ml/m LA Vol (A4C):   31.9 ml 23.61 ml/m LA Biplane Vol: 33.2 ml 24.57 ml/m  AORTIC VALVE AI PHT:      391 msec  AORTA Ao Root diam: 3.10 cm TRICUSPID VALVE TR Peak grad:   31.6 mmHg TR Vmax:        281.00 cm/s  SHUNTS Systemic Diam: 2.10 cm Laurance FlattenHeather Pemberton MD Electronically signed by Laurance FlattenHeather Pemberton MD Signature Date/Time: 07/17/2020/12:32:14 PM    Final     Cardiac Studies   Echo 07/17/20: 1. Left ventricular ejection fraction, by estimation, is 60 to 65%. The  left ventricle has normal function.  The left ventricle has no regional  wall motion abnormalities. Left ventricular diastolic parameters are  indeterminate due to atrial fibrillation.  2. Right ventricular systolic function is normal. The right ventricular  size is normal. There is normal pulmonary artery systolic pressure.  3. The mitral valve is normal in structure. Trivial mitral valve  regurgitation.  4. The aortic valve is tricuspid. There is mild calcification of the  aortic valve. There is mild thickening of the aortic valve. Aortic valve  regurgitation is mild to moderate.  5. The inferior vena cava is normal in size with greater than 50%  respiratory variability, suggesting right atrial pressure of 3 mmHg.   Patient Profile     Boneta LucksYtet Calles is a 87 y.o. male with a hx of diabetes, hypertension, CKD IIIa, and gout here with new onset atrial fibrillation.   Assessment & Plan    # PAF:  Currently back in sinus rhythm with PACs/PVCs.  Rates were better controlled in atrial fibrillation after adding metoprolol.  No significant bradycardia and he is feeling well.  Weighed risks/benefits of anticoagulation with him and family members.  Given his recent GI bleed with no desire for intervention, we will not start any blood thinners.  Continue amiodarone 200mg  bid x14 days followed by 200mg  daily.  He will need outpatient TFT/LFT.  Consider PFTs, though I'm not sure he can participate  in this.  Maintain K>4, Mg>2.    # Hypertension:  Home losartan has been held due to low BP.  Continue metoprolol and consider restarting losartan at a lower dose if needed.     [facility] will sign off.   Medication Recommendations:  Continue metoprolol  Other recommendations (labs, testing, etc):  TFT/LFT as outpatient Follow up as an outpatient:  We will arrange.  He needs help getting a PCP.   For questions or updates, please contact [facility] Please consult [email or web address] for contact info under        Signed, Chilton Si, MD  07/18/2020, 3:38 PM

## 2020-07-18 NOTE — Progress Notes (Signed)
Spoke w/ Dr. Mal Misty about patient 1000 metoprolol dose b/c a dose was given at 0251 per the night shift nurse Lauris Poag it was given early because night shift MD instructed her to do so). Dr. Mal Misty referred me to cardiology. AMION page sent to Dr. Duke Salvia to advise about 1000 metoprolol dose. Dr. Mal Misty wants cardiology to give input before dose is administered. Will continue to monitor.

## 2020-07-18 NOTE — Consult Note (Signed)
CLI Nurse Navigator  87 y/o male admitted with significant history of DM, HTN, Gout presents to ED 3.5.22 with complaints of right elbow pain and worsening pain to left ankle. Recently in [place] with diagnosis of anemia secondary to GI bleed, discharged 2.25.22. 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 Unable to get VAS [place] ABI due to patient complaints of pain. 2+ pedal pulses assessed on admission.   WOC is following patient for application of topical wound care to wounds R elbow and L lateral malleolus. TOC, Diabetes coordinator and dietitan consults in place.  No other CLI needs identified at this time.   Will remain available to this patient should issues arise.  Thank you,  Hilma Favors RN BSN CWS CLI Nurse Navigator [place] [phone]

## 2020-07-18 NOTE — Progress Notes (Addendum)
PROGRESS NOTE    Cleve Paolillo  DVV:[id number] DOB: [birth year] DOA: 07/15/2020 PCP: Patient, No Pcp Per   Brief Narrative: Ansen Sayegh is a 87 y.o. male with a history of gout, diabetes mellitus. Patient presented secondary to weakness and ankle pain with concern for acute gout. He was recently treated for ankle cellulitis. He also had a right elbow wound. Empiric antibiotics initiated. MRI of left ankle suggests severe gout flare. Arthrocentesis performed with evidence of urate crystals with possible evidence of bacterial infection.   Assessment & Plan:   Principal Problem:   Ankle wound, left, initial encounter Active Problems:   Chronic tophaceous gout of left foot   SIRS (systemic inflammatory response syndrome) (HCC)   Generalized weakness   Open wound of right elbow   Paroxysmal atrial fibrillation (HCC)   Tachycardia-bradycardia syndrome (HCC)   Essential hypertension   Ankle pain Acute gout This appears to be most consistent with severe acute gout. MRI confirms likely gout flare. Recently treated for cellulitis so this has been occurring for almost two weeks. Orthopedic surgery performed arthrocentesis without steroid injection secondary to concern for associated infection. Preliminary fluid analysis confirms urate crystals in addition to gram positive cocci on gram stain. Orthopedic surgery consulted for aspiration injection which was performed on 3/6. Culture is pending -Continue Tylenol/Morphine prn for pain; added Percocet prn -Follow-up aspirate fluid culture (reincubated)  SIRS Present on admission. No definitive infectious source identified yet. -Continue Vancomycin/Ceftriaxone  Left elbow wound From fall. Per history, there appears to have been drainage. Concern would be for possible septic joint. Unable to obtain MRI on admission, unfortunately. X-ray without effusion, but does show subcutaneous gas. Orthopedic surgery without concern for active infection at this  time. -Wound care recommendations: Cleanse wounds to left ankle and right posterior elbow with NS.  Apply Xeroform gauze to draining area.  Cover with dry gauze and kerlix/tape. Change daily.   Atrial fibrillation Associated rapid ventricular response. Started on metoprolol 50 mg BID with bradycardia. Transthoracic Echocardiogram without atrial pathology with some mild/moderate AR. Cardiology consulted for further management. -Cardiology recommendations: switched to metoprolol 37.5 mg BID and started on amiodarone  200 mg BID  Generalized weakness PT recommending home health; OT recommending SNF  Diabetes mellitus, type 2 Hemoglobin A1C of 6.3%  Primary hypertension History but not on medication per chart review. Blood pressure uncontrolled. Started on losartan which is currently held by cardiology recommendations.  History of tophaceous gout Possibly having an acute gout flare. He is on allopurinol and colchicine as an outpatient  History of upper GI bleeding Noted   DVT prophylaxis: SCDs Code Status:   Code Status: DNR Family Communication: Family member (granddaughter) at bedside Disposition Plan: Discharge likely home in 1-2 days pending improvement of pain/treatment of gout flare and workup/management for possible septic arthritis in addition to arrhythmia management   Consultants:   None  Procedures:   None  Antimicrobials:  Vancomycin  Ceftriaxone  Flagyl    Subjective:  Still with some pain in ankle and elbow but this is better. No other concerns.  Objective: Vitals:   07/18/20 0723 07/18/20 0724 07/18/20 1027 07/18/20 1126  BP: 118/63  (!) 143/84 106/61  Pulse: (!) 113 93 89 83  Resp: 14   14  Temp: 98.8 F (37.1 C)   99.1 F (37.3 C)  TempSrc: Oral   Oral  SpO2: 94% 94% 97% 93%  Weight:      Height:        Intake/Output  Summary (Last 24 hours) at 07/18/2020 1409 Last data filed at 07/18/2020 0400 Gross per 24 hour  Intake 2363.36 ml  Output  --  Net 2363.36 ml   Filed Weights   07/15/20 1558 07/15/20 2022  Weight: 50 kg 42.4 kg    Examination:  General exam: Appears calm and comfortable Respiratory system: Clear to auscultation. Respiratory effort normal. Cardiovascular system: S1 & S2 heard, RRR. No murmurs, rubs, gallops or clicks. Gastrointestinal system: Abdomen is nondistended, soft and nontender. No organomegaly or masses felt. Normal bowel sounds heard. Central nervous system: Alert Musculoskeletal: No calf tenderness. Left ankle is still swollen with reduced erythema and some tenderness. Right elbow with open wound and no visible drainage Skin: No cyanosis. No rashes     Data Reviewed: I have personally reviewed following labs and imaging studies  CBC Lab Results  Component Value Date   WBC 8.5 07/17/2020   RBC 3.47 (L) 07/17/2020   HGB 10.0 (L) 07/17/2020   HCT 31.6 (L) 07/17/2020   MCV 91.1 07/17/2020   MCH 28.8 07/17/2020   PLT 225 07/17/2020   MCHC 31.6 07/17/2020   RDW 15.4 07/17/2020   LYMPHSABS 1.5 07/15/2020   MONOABS 1.7 (H) 07/15/2020   EOSABS 0.0 07/15/2020   BASOSABS 0.0 07/15/2020     Last metabolic panel Lab Results  Component Value Date   NA 135 07/17/2020   K 3.3 (L) 07/17/2020   CL 105 07/17/2020   CO2 19 (L) 07/17/2020   BUN 25 (H) 07/17/2020   CREATININE 1.09 07/17/2020   GLUCOSE 138 (H) 07/17/2020   GFRNONAA >60 07/17/2020   GFRAA >60 04/22/2019   CALCIUM 7.5 (L) 07/17/2020   PROT 4.8 (L) 07/16/2020   ALBUMIN 1.8 (L) 07/16/2020   BILITOT 0.7 07/16/2020   ALKPHOS 52 07/16/2020   AST 37 07/16/2020   ALT 16 07/16/2020   ANIONGAP 11 07/17/2020    CBG (last 3)  Recent Labs    07/17/20 2025 07/18/20 0748 07/18/20 1121  GLUCAP 110* 77 113*     GFR: Estimated Creatinine Clearance: 27.6 mL/min (by C-G formula based on SCr of 1.09 mg/dL).  Coagulation Profile: No results for input(s): INR, PROTIME in the last 168 hours.  Recent Results (from the past 240  hour(s))  SARS CORONAVIRUS 2 (TAT 6-24 HRS) Nasopharyngeal Nasopharyngeal Swab     Status: None   Collection Time: 07/15/20  5:25 PM   Specimen: Nasopharyngeal Swab  Result Value Ref Range Status   SARS Coronavirus 2 NEGATIVE NEGATIVE Final    Comment: (NOTE) SARS-CoV-2 target nucleic acids are NOT DETECTED.  The SARS-CoV-2 RNA is generally detectable in upper and lower respiratory specimens during the acute phase of infection. Negative results do not preclude SARS-CoV-2 infection, do not rule out co-infections with other pathogens, and should not be used as the sole basis for treatment or other patient management decisions. Negative results must be combined with clinical observations, patient history, and epidemiological information. The expected result is Negative.  Fact Sheet for Patients: HairSlick.nohttps://www.fda.gov/media/138098/download  Fact Sheet for Healthcare Providers: quierodirigir.comhttps://www.fda.gov/media/138095/download  This test is not yet approved or cleared by the [place] FDA and  has been authorized for detection and/or diagnosis of SARS-CoV-2 by FDA under an Emergency Use Authorization (EUA). This EUA will remain  in effect (meaning this test can be used) for the duration of the COVID-19 declaration under Se ction 564(b)(1) of the Act, 21 U.S.C. section 360bbb-3(b)(1), unless the authorization is terminated or revoked sooner.  Performed at  [facility], [address] [place], [place] [address]   Blood culture (routine x 2)     Status: None (Preliminary result)   Collection Time: 07/15/20  5:35 PM   Specimen: BLOOD LEFT ARM  Result Value Ref Range Status   Specimen Description   Final    BLOOD LEFT ARM Performed at [facility], [address] [place], [place] [address]    Special Requests   Final    BOTTLES DRAWN AEROBIC AND ANAEROBIC Blood Culture adequate volume Performed at [facility], [address],  [place], [place] [address]    Culture   Final    NO GROWTH 2 DAYS Performed at [facility], [address] [place], [place] [address]    Report Status PENDING  Incomplete  MRSA PCR Screening     Status: None   Collection Time: 07/15/20  9:39 PM   Specimen: Nasal Mucosa; Nasopharyngeal  Result Value Ref Range Status   MRSA by PCR NEGATIVE NEGATIVE Final    Comment:        The GeneXpert MRSA Assay (FDA approved for NASAL specimens only), is one component of a comprehensive MRSA colonization surveillance program. It is not intended to diagnose MRSA infection nor to guide or monitor treatment for MRSA infections. Performed at [facility], [address], [place], [place] [address]   Aerobic Culture w Gram Stain (superficial specimen)     Status: None (Preliminary result)   Collection Time: 07/16/20  3:16 PM   Specimen: Foot; Body Fluid  Result Value Ref Range Status   Specimen Description   Final    FOOT LEFT Performed at [facility], [address], [place], [place] [address]    Special Requests   Final    NONE Performed at [facility], [address], [place], [place] [address]    Gram Stain   Final    MODERATE WBC PRESENT,BOTH PMN AND MONONUCLEAR RARE GRAM POSITIVE COCCI IN PAIRS    Culture   Final    CULTURE REINCUBATED FOR BETTER GROWTH Performed at [facility], [address] [place], [place] [address]    Report Status PENDING  Incomplete        Radiology Studies: ECHOCARDIOGRAM COMPLETE  Result Date: 07/17/2020    ECHOCARDIOGRAM REPORT   Patient Name:   Lovie Romanello   Date of Exam: 07/17/2020 Medical Rec #:  [id number]  Height:       60.0 in Accession #:    [id number] Weight:       93.5 lb Date of Birth:  1928/11/27  BSA:          1.351 m Patient Age:    89 years   BP:           106/49 mmHg Patient Gender: M          HR:           85 bpm. Exam Location:  Inpatient Procedure: 2D Echo Indications:     Atrial Flutter I48.92  History:        Patient has no prior history of Echocardiogram examinations.                 Risk Factors:Hypertension and Diabetes.  Sonographer:    Thurman Coyer RDCS (AE) Referring Phys: [phone] Quint Chestnut A Lyan Moyano IMPRESSIONS  1. Left ventricular ejection fraction, by estimation, is 60 to 65%. The left ventricle has normal  function. The left ventricle has no regional wall motion abnormalities. Left ventricular diastolic parameters are indeterminate due to atrial fibrillation.  2. Right ventricular systolic function is normal. The right ventricular size is normal. There is normal pulmonary artery systolic pressure.  3. The mitral valve is normal in structure. Trivial mitral valve regurgitation.  4. The aortic valve is tricuspid. There is mild calcification of the aortic valve. There is mild thickening of the aortic valve. Aortic valve regurgitation is mild to moderate.  5. The inferior vena cava is normal in size with greater than 50% respiratory variability, suggesting right atrial pressure of 3 mmHg. Comparison(s): No prior Echocardiogram. FINDINGS  Left Ventricle: Left ventricular ejection fraction, by estimation, is 60 to 65%. The left ventricle has normal function. The left ventricle has no regional wall motion abnormalities. The left ventricular internal cavity size was normal in size. There is  no left ventricular hypertrophy. Left ventricular diastolic parameters are indeterminate. Right Ventricle: The right ventricular size is normal. No increase in right ventricular wall thickness. Right ventricular systolic function is normal. There is normal pulmonary artery systolic pressure. The tricuspid regurgitant velocity is 2.81 m/s, and  with an assumed right atrial pressure of 3 mmHg, the estimated right ventricular systolic pressure is 34.6 mmHg. Left Atrium: Left atrial size was normal in size. Right Atrium: Right atrial size was normal in size. Pericardium: There is no evidence of  pericardial effusion. Mitral Valve: The mitral valve is normal in structure. There is mild thickening of the mitral valve leaflet(s). Trivial mitral valve regurgitation. Tricuspid Valve: The tricuspid valve is normal in structure. Tricuspid valve regurgitation is mild. Aortic Valve: The aortic valve is tricuspid. There is mild calcification of the aortic valve. There is mild thickening of the aortic valve. Aortic valve regurgitation is mild to moderate. Aortic regurgitation PHT measures 391 msec. Pulmonic Valve: The pulmonic valve was normal in structure. Pulmonic valve regurgitation is trivial. Aorta: The aortic root and ascending aorta are structurally normal, with no evidence of dilitation. Venous: The inferior vena cava is normal in size with greater than 50% respiratory variability, suggesting right atrial pressure of 3 mmHg. IAS/Shunts: No atrial level shunt detected by color flow Doppler.  LEFT VENTRICLE PLAX 2D LVIDd:         4.60 cm LVIDs:         2.40 cm LV PW:         0.80 cm LV IVS:        0.90 cm LVOT diam:     2.10 cm LVOT Area:     3.46 cm  RIGHT VENTRICLE TAPSE (M-mode): 1.6 cm LEFT ATRIUM             Index       RIGHT ATRIUM          Index LA diam:        3.30 cm 2.44 cm/m  RA Area:     8.07 cm LA Vol (A2C):   32.2 ml 23.83 ml/m RA Volume:   12.60 ml 9.33 ml/m LA Vol (A4C):   31.9 ml 23.61 ml/m LA Biplane Vol: 33.2 ml 24.57 ml/m  AORTIC VALVE AI PHT:      391 msec  AORTA Ao Root diam: 3.10 cm TRICUSPID VALVE TR Peak grad:   31.6 mmHg TR Vmax:        281.00 cm/s  SHUNTS Systemic Diam: 2.10 cm Laurance Flatten MD Electronically signed by Laurance Flatten MD Signature Date/Time: 07/17/2020/12:32:14 PM  Final         Scheduled Meds: . amiodarone  200 mg Oral BID  . colchicine  0.6 mg Oral Daily  . dexamethasone (DECADRON) injection  8 mg Intra-articular Once  . feeding supplement  237 mL Oral BID BM  . insulin aspart  0-9 Units Subcutaneous TID WC  . metoprolol tartrate  37.5 mg  Oral BID  . nutrition supplement (JUVEN)  1 packet Oral BID WC   Continuous Infusions: . cefTRIAXone (ROCEPHIN)  IV 2 g (07/18/20 1033)  . lactated ringers 75 mL/hr at 07/18/20 1039  . vancomycin Stopped (07/17/20 2220)     LOS: 2 days     Jacquelin Hawking, MD [facility] 07/18/2020, 2:09 PM  If 7PM-7AM, please contact night-coverage [email or web address]

## 2020-07-19 LAB — CBC
HCT: 32.5 % — ABNORMAL LOW (ref 39.0–52.0)
Hemoglobin: 10.3 g/dL — ABNORMAL LOW (ref 13.0–17.0)
MCH: 28.1 pg (ref 26.0–34.0)
MCHC: 31.7 g/dL (ref 30.0–36.0)
MCV: 88.8 fL (ref 80.0–100.0)
Platelets: 267 K/uL (ref 150–400)
RBC: 3.66 MIL/uL — ABNORMAL LOW (ref 4.22–5.81)
RDW: 15.3 % (ref 11.5–15.5)
WBC: 8.7 K/uL (ref 4.0–10.5)
nRBC: 0 % (ref 0.0–0.2)

## 2020-07-19 LAB — GLUCOSE, CAPILLARY
Glucose-Capillary: 84 mg/dL (ref 70–99)
Glucose-Capillary: 81 mg/dL (ref 70–99)
Glucose-Capillary: 114 mg/dL — ABNORMAL HIGH (ref 70–99)
Glucose-Capillary: 112 mg/dL — ABNORMAL HIGH (ref 70–99)

## 2020-07-19 LAB — BASIC METABOLIC PANEL
Anion gap: 8 (ref 5–15)
BUN: 18 mg/dL (ref 8–23)
CO2: 22 mmol/L (ref 22–32)
Calcium: 7.4 mg/dL — ABNORMAL LOW (ref 8.9–10.3)
Chloride: 108 mmol/L (ref 98–111)
Creatinine, Ser: 0.83 mg/dL (ref 0.61–1.24)
GFR, Estimated: >60 mL/min (ref >60)
Glucose, Bld: 104 mg/dL — ABNORMAL HIGH (ref 70–99)
Potassium: 3.6 mmol/L (ref 3.5–5.1)
Sodium: 138 mmol/L (ref 135–145)

## 2020-07-19 NOTE — Progress Notes (Signed)
PROGRESS NOTE    Titan Karner  XQJ:[id number] DOB: [birth year] DOA: 07/15/2020 PCP: Patient, No Pcp Per   Brief Narrative: Ric Rosenberg is a 87 y.o. male with a history of gout, diabetes mellitus. Patient presented secondary to weakness and ankle pain with concern for acute gout. He was recently treated for ankle cellulitis. He also had a right elbow wound. Empiric antibiotics initiated. MRI of left ankle suggests severe gout flare. Arthrocentesis performed with evidence of urate crystals with possible evidence of bacterial infection.   Assessment & Plan:   Principal Problem:   Ankle wound, left, initial encounter Active Problems:   Chronic tophaceous gout of left foot   SIRS (systemic inflammatory response syndrome) (HCC)   Generalized weakness   Open wound of right elbow   Paroxysmal atrial fibrillation (HCC)   Tachycardia-bradycardia syndrome (HCC)   Essential hypertension   Ankle pain Acute gout This appears to be most consistent with severe acute gout. MRI confirms likely gout flare. Recently treated for cellulitis so this has been occurring for almost two weeks. Orthopedic surgery performed arthrocentesis without steroid injection secondary to concern for associated infection. Preliminary fluid analysis confirms urate crystals in addition to gram positive cocci on gram stain. Orthopedic surgery consulted for aspiration injection which was performed on 3/6. Culture is pending -Continue Tylenol/Morphine prn for pain; added Percocet prn -Follow-up aspirate fluid culture (reincubated)  SIRS, without sepsis - Present on admission. No definitive infectious source identified yet. -Continue Vancomycin/Ceftriaxone to cover possible joint infection, less likely given above - follow cultures  Left elbow trauma, s/p fall Per history, there appears to have been questionable drainage previously.  Unable to obtain MRI - X-ray without effusion, but does show subcutaneous gas.  Orthopedic surgery  without concern for active infection at this time. -Wound care recommendations: Cleanse wounds to left ankle and right posterior elbow with NS.  Apply Xeroform gauze to draining area.  Cover with dry gauze and kerlix/tape. Change daily.   Atrial fibrillation Associated rapid ventricular response.  Transthoracic Echocardiogram without atrial pathology with some mild/moderate AR. Cardiology recommendations: switched to metoprolol 37.5 mg BID and started on amiodarone  200 mg BID  Generalized weakness PT following - family hoping to take patient home - will work on home health.  Diabetes mellitus, type 2, borderline uncontrolled Hemoglobin A1C of 6.3%  Primary hypertension History but not on medication per chart review. Blood pressure uncontrolled. Started on losartan which is currently held by cardiology recommendations.  History of tophaceous gout Possibly having an acute gout flare. He is on allopurinol and colchicine as an outpatient  DVT prophylaxis: SCDs Code Status:   Code Status: DNR Family Communication: Family member daughter at bedside able to translate with her. Disposition Plan: Discharge likely home in 1-2 days pending improvement of pain/treatment of gout flare and workup/management for possible septic arthritis in addition to arrhythmia management  Consultants:   Cardiology, Orthopedics  Procedures:   None  Antimicrobials:  Vancomycin  Ceftriaxone  Flagyl    Subjective: No acute issues/events overnight; Still with some pain in ankle and elbow but this is better. No other concerns per patient/family.  Objective: Vitals:   07/18/20 1514 07/18/20 2026 07/19/20 0344 07/19/20 0523  BP: 123/73 137/75 (!) 150/71   Pulse: 73 87 85   Resp: 14 18 18    Temp: 98.8 F (37.1 C) 98.4 F (36.9 C) 98.2 F (36.8 C)   TempSrc: Oral Oral Oral   SpO2: 94% 95% 91%   Weight:  Height:    5\' 6"  (1.676 m)    Intake/Output Summary (Last 24 hours) at 07/19/2020  0805 Last data filed at 07/19/2020 0746 Gross per 24 hour  Intake 100 ml  Output -  Net 100 ml   Filed Weights   07/15/20 1558 07/15/20 2022  Weight: 50 kg 42.4 kg    Examination:  General exam: Appears calm and comfortable Respiratory system: Clear to auscultation. Respiratory effort normal. Cardiovascular system: S1 & S2 heard, RRR. No murmurs, rubs, gallops or clicks. Gastrointestinal system: Abdomen is nondistended, soft and nontender. No organomegaly or masses felt. Normal bowel sounds heard. Central nervous system: Alert Musculoskeletal: No calf tenderness. Left ankle with minimal tenderness. Right elbow with open wound and no visible drainage - decreased ROM secondary to pain. Skin: No cyanosis. No rashes   Data Reviewed: I have personally reviewed following labs and imaging studies  CBC Lab Results  Component Value Date   WBC 8.7 07/19/2020   RBC 3.66 (L) 07/19/2020   HGB 10.3 (L) 07/19/2020   HCT 32.5 (L) 07/19/2020   MCV 88.8 07/19/2020   MCH 28.1 07/19/2020   PLT 267 07/19/2020   MCHC 31.7 07/19/2020   RDW 15.3 07/19/2020   LYMPHSABS 1.5 07/15/2020   MONOABS 1.7 (H) 07/15/2020   EOSABS 0.0 07/15/2020   BASOSABS 0.0 07/15/2020     Last metabolic panel Lab Results  Component Value Date   NA 138 07/19/2020   K 3.6 07/19/2020   CL 108 07/19/2020   CO2 22 07/19/2020   BUN 18 07/19/2020   CREATININE 0.83 07/19/2020   GLUCOSE 104 (H) 07/19/2020   GFRNONAA >60 07/19/2020   GFRAA >60 04/22/2019   CALCIUM 7.4 (L) 07/19/2020   PROT 4.8 (L) 07/16/2020   ALBUMIN 1.8 (L) 07/16/2020   BILITOT 0.7 07/16/2020   ALKPHOS 52 07/16/2020   AST 37 07/16/2020   ALT 16 07/16/2020   ANIONGAP 8 07/19/2020    CBG (last 3)  Recent Labs    07/18/20 1610 07/18/20 2150 07/19/20 0754  GLUCAP 134* 116* 84     GFR: Estimated Creatinine Clearance: 36.2 mL/min (by C-G formula based on SCr of 0.83 mg/dL).  Coagulation Profile: No results for input(s): INR, PROTIME in  the last 168 hours.  Recent Results (from the past 240 hour(s))  SARS CORONAVIRUS 2 (TAT 6-24 HRS) Nasopharyngeal Nasopharyngeal Swab     Status: None   Collection Time: 07/15/20  5:25 PM   Specimen: Nasopharyngeal Swab  Result Value Ref Range Status   SARS Coronavirus 2 NEGATIVE NEGATIVE Final    Comment: (NOTE) SARS-CoV-2 target nucleic acids are NOT DETECTED.  The SARS-CoV-2 RNA is generally detectable in upper and lower respiratory specimens during the acute phase of infection. Negative results do not preclude SARS-CoV-2 infection, do not rule out co-infections with other pathogens, and should not be used as the sole basis for treatment or other patient management decisions. Negative results must be combined with clinical observations, patient history, and epidemiological information. The expected result is Negative.  Fact Sheet for Patients: 09/14/20  Fact Sheet for Healthcare Providers: HairSlick.no  This test is not yet approved or cleared by the quierodirigir.com FDA and  has been authorized for detection and/or diagnosis of SARS-CoV-2 by FDA under an Emergency Use Authorization (EUA). This EUA will remain  in effect (meaning this test can be used) for the duration of the COVID-19 declaration under Se ction 564(b)(1) of the Act, 21 U.S.C. section 360bbb-3(b)(1), unless the authorization is  terminated or revoked sooner.  Performed at [facility], [address] [place], [place] [address]   Blood culture (routine x 2)     Status: None (Preliminary result)   Collection Time: 07/15/20  5:35 PM   Specimen: BLOOD LEFT ARM  Result Value Ref Range Status   Specimen Description   Final    BLOOD LEFT ARM Performed at [facility], [address], [place], [place] [address]    Special Requests   Final    BOTTLES DRAWN AEROBIC AND ANAEROBIC Blood Culture adequate volume Performed at [facility], [address], [place], [place] [address]    Culture   Final    NO GROWTH 2 DAYS Performed at [facility], [address] [place], [place] [address]    Report Status PENDING  Incomplete  MRSA PCR Screening     Status: None   Collection Time: 07/15/20  9:39 PM   Specimen: Nasal Mucosa; Nasopharyngeal  Result Value Ref Range Status   MRSA by PCR NEGATIVE NEGATIVE Final    Comment:        The GeneXpert MRSA Assay (FDA approved for NASAL specimens only), is one component of a comprehensive MRSA colonization surveillance program. It is not intended to diagnose MRSA infection nor to guide or monitor treatment for MRSA infections. Performed at [facility], [address] [place], [place] [address]   Aerobic Culture w Gram Stain (superficial specimen)     Status: None (Preliminary result)   Collection Time: 07/16/20  3:16 PM   Specimen: Foot; Body Fluid  Result Value Ref Range Status   Specimen Description   Final    FOOT LEFT Performed at [facility], [address], [place], [place] [address]    Special Requests   Final    NONE Performed at [facility], [address] [place], [place] [address]    Gram Stain   Final    MODERATE WBC PRESENT,BOTH PMN AND MONONUCLEAR RARE GRAM POSITIVE COCCI IN PAIRS    Culture   Final    RARE STAPHYLOCOCCUS AUREUS CULTURE REINCUBATED FOR BETTER GROWTH Performed at [facility], [address], [place], [place] [address]    Report Status PENDING  Incomplete        Radiology Studies: ECHOCARDIOGRAM COMPLETE  Result Date: 07/17/2020    ECHOCARDIOGRAM REPORT   Patient Name:   Adrick Spychalski   Date of Exam: 07/17/2020 Medical Rec #:  [id number]  Height:       60.0 in Accession #:    [id number] Weight:       93.5 lb Date of Birth:  1928-09-06  BSA:          1.351 m Patient Age:    89 years   BP:           106/49 mmHg Patient Gender: M          HR:            85 bpm. Exam Location:  Inpatient Procedure: 2D Echo Indications:    Atrial Flutter I48.92  History:        Patient has no prior history of Echocardiogram examinations.                 Risk Factors:Hypertension and Diabetes.  Sonographer:    Thurman Coyer RDCS (AE) Referring Phys: [phone] RALPH A NETTEY IMPRESSIONS  1. Left ventricular ejection fraction, by  estimation, is 60 to 65%. The left ventricle has normal function. The left ventricle has no regional wall motion abnormalities. Left ventricular diastolic parameters are indeterminate due to atrial fibrillation.  2. Right ventricular systolic function is normal. The right ventricular size is normal. There is normal pulmonary artery systolic pressure.  3. The mitral valve is normal in structure. Trivial mitral valve regurgitation.  4. The aortic valve is tricuspid. There is mild calcification of the aortic valve. There is mild thickening of the aortic valve. Aortic valve regurgitation is mild to moderate.  5. The inferior vena cava is normal in size with greater than 50% respiratory variability, suggesting right atrial pressure of 3 mmHg. Comparison(s): No prior Echocardiogram. FINDINGS  Left Ventricle: Left ventricular ejection fraction, by estimation, is 60 to 65%. The left ventricle has normal function. The left ventricle has no regional wall motion abnormalities. The left ventricular internal cavity size was normal in size. There is  no left ventricular hypertrophy. Left ventricular diastolic parameters are indeterminate. Right Ventricle: The right ventricular size is normal. No increase in right ventricular wall thickness. Right ventricular systolic function is normal. There is normal pulmonary artery systolic pressure. The tricuspid regurgitant velocity is 2.81 m/s, and  with an assumed right atrial pressure of 3 mmHg, the estimated right ventricular systolic pressure is 34.6 mmHg. Left Atrium: Left atrial size was normal in size. Right Atrium: Right  atrial size was normal in size. Pericardium: There is no evidence of pericardial effusion. Mitral Valve: The mitral valve is normal in structure. There is mild thickening of the mitral valve leaflet(s). Trivial mitral valve regurgitation. Tricuspid Valve: The tricuspid valve is normal in structure. Tricuspid valve regurgitation is mild. Aortic Valve: The aortic valve is tricuspid. There is mild calcification of the aortic valve. There is mild thickening of the aortic valve. Aortic valve regurgitation is mild to moderate. Aortic regurgitation PHT measures 391 msec. Pulmonic Valve: The pulmonic valve was normal in structure. Pulmonic valve regurgitation is trivial. Aorta: The aortic root and ascending aorta are structurally normal, with no evidence of dilitation. Venous: The inferior vena cava is normal in size with greater than 50% respiratory variability, suggesting right atrial pressure of 3 mmHg. IAS/Shunts: No atrial level shunt detected by color flow Doppler.  LEFT VENTRICLE PLAX 2D LVIDd:         4.60 cm LVIDs:         2.40 cm LV PW:         0.80 cm LV IVS:        0.90 cm LVOT diam:     2.10 cm LVOT Area:     3.46 cm  RIGHT VENTRICLE TAPSE (M-mode): 1.6 cm LEFT ATRIUM             Index       RIGHT ATRIUM          Index LA diam:        3.30 cm 2.44 cm/m  RA Area:     8.07 cm LA Vol (A2C):   32.2 ml 23.83 ml/m RA Volume:   12.60 ml 9.33 ml/m LA Vol (A4C):   31.9 ml 23.61 ml/m LA Biplane Vol: 33.2 ml 24.57 ml/m  AORTIC VALVE AI PHT:      391 msec  AORTA Ao Root diam: 3.10 cm TRICUSPID VALVE TR Peak grad:   31.6 mmHg TR Vmax:        281.00 cm/s  SHUNTS Systemic Diam: 2.10 cm Laurance FlattenHeather Pemberton MD Electronically signed by  Laurance Flatten MD Signature Date/Time: 07/17/2020/12:32:14 PM    Final         Scheduled Meds: . amiodarone  200 mg Oral BID  . colchicine  0.6 mg Oral Daily  . dexamethasone (DECADRON) injection  8 mg Intra-articular Once  . feeding supplement  237 mL Oral BID BM  . insulin  aspart  0-9 Units Subcutaneous TID WC  . metoprolol tartrate  37.5 mg Oral BID  . nutrition supplement (JUVEN)  1 packet Oral BID WC   Continuous Infusions: . cefTRIAXone (ROCEPHIN)  IV 2 g (07/18/20 1033)  . lactated ringers 75 mL/hr at 07/18/20 2127  . vancomycin Stopped (07/18/20 2224)     LOS: 3 days   Carma Leaven DO 07/19/2020, 8:05 AM  If 7PM-7AM, please contact night-coverage [email or web address]

## 2020-07-19 NOTE — Progress Notes (Signed)
Pharmacy Antibiotic Note  Brian Potter is a 87 y.o. male admitted on 07/15/2020 with diabetic foot infection.  MRI pending to rule out osteomyelitis. Pharmacy has been consulted for Vancomycin dosing.  Renal function at patient's baseline.   Plan: Rocephin 2 gr IV q24h  Vancomycin 500mg  IV q24h to target AUC 400-550 Check Vancomycin levels at steady state Monitor renal function and cx data   Height: 5\' 6"  (167.6 cm) Weight: 42.4 kg (93 lb 7.6 oz) IBW/kg (Calculated) : 63.8  Temp (24hrs), Avg:98.5 F (36.9 C), Min:98.2 F (36.8 C), Max:98.8 F (37.1 C)  Recent Labs  Lab 07/15/20 1701 07/15/20 1859 07/16/20 0332 07/17/20 0852 07/19/20 0336  WBC 14.7*  --  12.0* 8.5 8.7  CREATININE 1.18  --  0.96 1.09 0.83  LATICACIDVEN 2.1* 1.3  --   --   --     Estimated Creatinine Clearance: 36.2 mL/min (by C-G formula based on SCr of 0.83 mg/dL).    No Known Allergies  Antimicrobials this admission: 3/5 Cefepime x1 3/5 Vanc >>  3/5 Flagyl >> 3/7 3/6 Rocephin >>  Dose adjustments this admission:  Microbiology results: 3/5 BCx: ngtd 3/5 MRSA PCR: neg 3/6 left foot: rare S. Aureus   Thank you for allowing pharmacy to be a part of this patient's care.   09/18/20, PharmD, BCPS 07/19/2020 11:52 AM

## 2020-07-19 NOTE — Progress Notes (Addendum)
Physical Therapy Treatment Patient Details Name: Brian Potter MRN: [id number] DOB: [birth year] Today's Date: 07/19/2020    History of Present Illness 87 y.o. male with past medical history significant for CKD stage IIIa presents to ED 07/15/20, with complaint of pain in the right elbow and left lower extremity. Recently in  [facility] w/ Dx of anemia possibly 2* GIB, DC 07/07/20. Imaging showed tophaceous gout L ankle and chronic R elbow fracture deformity.    PT Comments    Daughter present, interpreting using google. Patient indicates left foot too painful to attempt sitting up/standing. Patient  Assisted with rolling to get cleaned up and change bed pad.  PT asked Daughter< who  does not live with patient , if his family can take care of the patient  In his current state. She states to discuss with other family. Posed  If a hospital bed may be indicated, daughter will ask other family. Patient has not tolerated  OOB this visit.  Continue efforts for mobility.  Follow Up Recommendations  Home health PT;Supervision for mobility/OOB vs SNf , with language barrier, may be a challenge. Dtr is staying to act as interpreter while here in hospital.     Equipment Recommendations  None recommended by PT    Recommendations for Other Services       Precautions / Restrictions Precautions Precautions: Fall Precaution Comments: significant pain left foot and right elbow, grimaces    Mobility  Bed Mobility Overal bed mobility: Needs Assistance Bed Mobility: Supine to Sit;Rolling Rolling: Min assist         General bed mobility comments: did roll to each side to be cleaneed up. Attempted to mobilize, patient indicating lt foot pain and unable to procede with mobility. Repositioned lt leg and rt UE.. "I can't sit up".  Floated heels.    Transfers                    Ambulation/Gait                 Stairs             Wheelchair Mobility    Modified Rankin (Stroke Patients  Only)       Balance                                            Cognition Arousal/Alertness: Awake/alert   Overall Cognitive Status: Difficult to assess                                 General Comments: pt. keeping eyes closed. minimal  responses to questions via interpreter by dtr.      Exercises      General Comments        Pertinent Vitals/Pain Faces Pain Scale: Hurts worst Pain Location: via translator, " I can not put my foot down" Pain Descriptors / Indicators: Grimacing;Guarding Pain Intervention(s): Limited activity within patient's tolerance;Monitored during session;Repositioned    Home Living                      Prior Function            PT Goals (current goals can now be found in the care plan section) Progress towards PT goals: Not progressing toward goals - comment (pain limiting)  Frequency    Min 2X/week      PT Plan Current plan remains appropriate    Co-evaluation              AM-PAC PT "6 Clicks" Mobility   Outcome Measure  Help needed turning from your back to your side while in a flat bed without using bedrails?: A Lot Help needed moving from lying on your back to sitting on the side of a flat bed without using bedrails?: A Lot Help needed moving to and from a bed to a chair (including a wheelchair)?: Total Help needed standing up from a chair using your arms (e.g., wheelchair or bedside chair)?: Total Help needed to walk in hospital room?: Total Help needed climbing 3-5 steps with a railing? : Total 6 Click Score: 8    End of Session   Activity Tolerance: Patient limited by pain Patient left: in bed;with call bell/phone within reach;with bed alarm set;with family/visitor present Nurse Communication: Mobility status PT Visit Diagnosis: Difficulty in walking, not elsewhere classified (R26.2);Pain Pain - Right/Left: Left Pain - part of body: Ankle and joints of foot     Time:  1450-1520 PT Time Calculation (min) (ACUTE ONLY): 30 min  Charges:  $Therapeutic Activity: 23-37 mins                     Blanchard Kelch PT Acute Rehabilitation Services Pager [phone] Office [phone]    Rada Hay 07/19/2020, 3:38 PM

## 2020-07-20 LAB — COMPREHENSIVE METABOLIC PANEL
ALT: 26 U/L (ref 0–44)
AST: 142 U/L — ABNORMAL HIGH (ref 15–41)
Albumin: 2.3 g/dL — ABNORMAL LOW (ref 3.5–5.0)
Alkaline Phosphatase: 89 U/L (ref 38–126)
Anion gap: 7 (ref 5–15)
BUN: 19 mg/dL (ref 8–23)
CO2: 24 mmol/L (ref 22–32)
Calcium: 7.5 mg/dL — ABNORMAL LOW (ref 8.9–10.3)
Chloride: 105 mmol/L (ref 98–111)
Creatinine, Ser: 0.81 mg/dL (ref 0.61–1.24)
GFR, Estimated: >60 mL/min (ref >60)
Glucose, Bld: 91 mg/dL (ref 70–99)
Potassium: 3.3 mmol/L — ABNORMAL LOW (ref 3.5–5.1)
Sodium: 136 mmol/L (ref 135–145)
Total Bilirubin: 0.6 mg/dL (ref 0.3–1.2)
Total Protein: 5.5 g/dL — ABNORMAL LOW (ref 6.5–8.1)

## 2020-07-20 LAB — CBC
HCT: 35.1 % — ABNORMAL LOW (ref 39.0–52.0)
Hemoglobin: 11 g/dL — ABNORMAL LOW (ref 13.0–17.0)
MCH: 27.8 pg (ref 26.0–34.0)
MCHC: 31.3 g/dL (ref 30.0–36.0)
MCV: 88.9 fL (ref 80.0–100.0)
Platelets: 270 10*3/uL (ref 150–400)
RBC: 3.95 MIL/uL — ABNORMAL LOW (ref 4.22–5.81)
RDW: 15.3 % (ref 11.5–15.5)
WBC: 9.4 10*3/uL (ref 4.0–10.5)
nRBC: 0 % (ref 0.0–0.2)

## 2020-07-20 LAB — AEROBIC CULTURE W GRAM STAIN (SUPERFICIAL SPECIMEN)

## 2020-07-20 LAB — GLUCOSE, CAPILLARY
Glucose-Capillary: 65 mg/dL — ABNORMAL LOW (ref 70–99)
Glucose-Capillary: 109 mg/dL — ABNORMAL HIGH (ref 70–99)
Glucose-Capillary: 87 mg/dL (ref 70–99)
Glucose-Capillary: 84 mg/dL (ref 70–99)
Glucose-Capillary: 88 mg/dL (ref 70–99)

## 2020-07-20 MED ORDER — HYOSCYAMINE SULFATE 0.125 MG/5ML PO ELIX
0.2500 mg | ORAL_SOLUTION | Freq: Four times a day (QID) | ORAL | Status: DC | PRN
  Administered 2020-07-20: 0.25 mg via ORAL
  Filled 2020-07-20 (×2): qty 10

## 2020-07-20 NOTE — Progress Notes (Signed)
PROGRESS NOTE    Kamarie Palma  LFY:[id number] DOB: 03/15/1929 DOA: 07/15/2020 PCP: Patient, No Pcp Per   Brief Narrative: Delmos Velaquez is a 87 y.o. male with a history of gout, diabetes mellitus. Patient presented secondary to weakness and ankle pain with concern for acute gout. He was recently treated for ankle cellulitis. He also had a right elbow wound. Empiric antibiotics initiated. MRI of left ankle suggests severe gout flare. Arthrocentesis performed with evidence of urate crystals with possible evidence of bacterial infection.   Assessment & Plan:   Principal Problem:   Ankle wound, left, initial encounter Active Problems:   Chronic tophaceous gout of left foot   SIRS (systemic inflammatory response syndrome) (HCC)   Generalized weakness   Open wound of right elbow   Paroxysmal atrial fibrillation (HCC)   Tachycardia-bradycardia syndrome (HCC)   Essential hypertension   Ankle pain Acute gout This appears to be most consistent with severe acute gout. MRI confirms likely gout flare.  Recently treated for cellulitis so this has been occurring for almost two weeks.  Orthopedic surgery performed arthrocentesis without steroid injection secondary to concern for associated infection. Preliminary fluid analysis confirms urate crystals in addition to gram positive cocci on gram stain.  Orthopedic surgery consulted for aspiration of joint space performed on 3/6. Culture is pending -Continue Tylenol/Morphine prn for pain; added Percocet prn -Follow-up aspirate fluid culture (reincubated)  SIRS, without sepsis - Present on admission. No definitive infectious source identified yet. -Continue Vancomycin/Ceftriaxone to cover possible joint infection, less likely given above - follow cultures  Left elbow trauma, chronic, s/p distant fall Per history, there appears to have been questionable drainage previously.  Unable to obtain MRI - X-ray without effusion, but does show subcutaneous gas.   Orthopedic surgery without concern for active infection at this time. -Wound care recommendations: Cleanse wounds to left ankle and right posterior elbow with NS.  Apply Xeroform gauze to draining area.  Cover with dry gauze and kerlix/tape. Change daily.   Atrial fibrillation Associated rapid ventricular response.  Transthoracic Echocardiogram without atrial pathology with some mild/moderate AR. Cardiology recommendations: switched to metoprolol 37.5 mg BID and started on amiodarone  200 mg BID  Generalized weakness PT following - family hoping to take patient home - will work on home health.  Diabetes mellitus, type 2, borderline uncontrolled Hemoglobin A1C of 6.3%  Primary hypertension History but not on medication per chart review. Blood pressure uncontrolled. Started on losartan which is currently held by cardiology recommendations.  History of tophaceous gout Possibly having an acute gout flare. He is on allopurinol and colchicine as an outpatient  DVT prophylaxis: SCDs Code Status:   Code Status: DNR Family Communication: Family member daughter at bedside able to translate. Disposition Plan: Discharge likely home in 1-2 days pending improvement of pain/treatment of gout flare and workup/management for possible septic arthritis in addition to arrhythmia management  Consultants:   Cardiology, Orthopedics  Procedures:   None  Antimicrobials:  Vancomycin  Ceftriaxone  Flagyl    Subjective: No acute issues/events overnight; patient able to translate with family at bedside indicates his ankle is somewhat more painful today than yesterday but elbow appears to be resolving, range of motion still limited at baseline but pain is somewhat improving.  Objective: Vitals:   07/19/20 0523 07/19/20 2008 07/20/20 0527 07/20/20 1044  BP:  118/73 138/64 (!) 156/64  Pulse:  83 79 90  Resp:  18 18   Temp:  99 F (37.2 C) 98 F (  36.7 C)   TempSrc:  Oral    SpO2:  91% 99%    Weight:      Height: 5\' 6"  (1.676 m)       Intake/Output Summary (Last 24 hours) at 07/20/2020 1338 Last data filed at 07/19/2020 2122 Gross per 24 hour  Intake 25 ml  Output 225 ml  Net -200 ml   Filed Weights   07/15/20 1558 07/15/20 2022  Weight: 50 kg 42.4 kg    Examination:  General exam: Appears calm and comfortable Respiratory system: Clear to auscultation. Respiratory effort normal. Cardiovascular system: S1 & S2 heard, RRR. No murmurs, rubs, gallops or clicks. Gastrointestinal system: Abdomen is nondistended, soft and nontender. No organomegaly or masses felt. Normal bowel sounds heard. Central nervous system: Alert Musculoskeletal: No calf tenderness. Left ankle and right elbow dressings clean dry intact- decreased ROM secondary to pain. Skin: No cyanosis. No rashes   Data Reviewed: I have personally reviewed following labs and imaging studies  CBC Lab Results  Component Value Date   WBC 9.4 07/20/2020   RBC 3.95 (L) 07/20/2020   HGB 11.0 (L) 07/20/2020   HCT 35.1 (L) 07/20/2020   MCV 88.9 07/20/2020   MCH 27.8 07/20/2020   PLT 270 07/20/2020   MCHC 31.3 07/20/2020   RDW 15.3 07/20/2020   LYMPHSABS 1.5 07/15/2020   MONOABS 1.7 (H) 07/15/2020   EOSABS 0.0 07/15/2020   BASOSABS 0.0 07/15/2020     Last metabolic panel Lab Results  Component Value Date   NA 136 07/20/2020   K 3.3 (L) 07/20/2020   CL 105 07/20/2020   CO2 24 07/20/2020   BUN 19 07/20/2020   CREATININE 0.81 07/20/2020   GLUCOSE 91 07/20/2020   GFRNONAA >60 07/20/2020   GFRAA >60 04/22/2019   CALCIUM 7.5 (L) 07/20/2020   PROT 5.5 (L) 07/20/2020   ALBUMIN 2.3 (L) 07/20/2020   BILITOT 0.6 07/20/2020   ALKPHOS 89 07/20/2020   AST 142 (H) 07/20/2020   ALT 26 07/20/2020   ANIONGAP 7 07/20/2020    CBG (last 3)  Recent Labs    07/20/20 0754 07/20/20 0835 07/20/20 1152  GLUCAP 65* 109* 87     GFR: Estimated Creatinine Clearance: 37.1 mL/min (by C-G formula based on SCr of 0.81  mg/dL).  Coagulation Profile: No results for input(s): INR, PROTIME in the last 168 hours.  Recent Results (from the past 240 hour(s))  SARS CORONAVIRUS 2 (TAT 6-24 HRS) Nasopharyngeal Nasopharyngeal Swab     Status: None   Collection Time: 07/15/20  5:25 PM   Specimen: Nasopharyngeal Swab  Result Value Ref Range Status   SARS Coronavirus 2 NEGATIVE NEGATIVE Final    Comment: (NOTE) SARS-CoV-2 target nucleic acids are NOT DETECTED.  The SARS-CoV-2 RNA is generally detectable in upper and lower respiratory specimens during the acute phase of infection. Negative results do not preclude SARS-CoV-2 infection, do not rule out co-infections with other pathogens, and should not be used as the sole basis for treatment or other patient management decisions. Negative results must be combined with clinical observations, patient history, and epidemiological information. The expected result is Negative.  Fact Sheet for Patients: 09/14/20  Fact Sheet for Healthcare Providers: HairSlick.no  This test is not yet approved or cleared by the quierodirigir.com FDA and  has been authorized for detection and/or diagnosis of SARS-CoV-2 by FDA under an Emergency Use Authorization (EUA). This EUA will remain  in effect (meaning this test can be used) for the duration of  the COVID-19 declaration under Se ction 564(b)(1) of the Act, 21 U.S.C. section 360bbb-3(b)(1), unless the authorization is terminated or revoked sooner.  Performed at [facility], [address] [place], [place] [address]   Blood culture (routine x 2)     Status: None (Preliminary result)   Collection Time: 07/15/20  5:35 PM   Specimen: BLOOD LEFT ARM  Result Value Ref Range Status   Specimen Description   Final    BLOOD LEFT ARM Performed at [facility], [address] [place], [place] [address]    Special Requests   Final    BOTTLES DRAWN  AEROBIC AND ANAEROBIC Blood Culture adequate volume Performed at [facility], [address], [place], [place] [address]    Culture   Final    NO GROWTH 3 DAYS Performed at [facility], [address], [place], [place] [address]    Report Status PENDING  Incomplete  MRSA PCR Screening     Status: None   Collection Time: 07/15/20  9:39 PM   Specimen: Nasal Mucosa; Nasopharyngeal  Result Value Ref Range Status   MRSA by PCR NEGATIVE NEGATIVE Final    Comment:        The GeneXpert MRSA Assay (FDA approved for NASAL specimens only), is one component of a comprehensive MRSA colonization surveillance program. It is not intended to diagnose MRSA infection nor to guide or monitor treatment for MRSA infections. Performed at [facility], [address] [place], [place] [address]   Aerobic Culture w Gram Stain (superficial specimen)     Status: None (Preliminary result)   Collection Time: 07/16/20  3:16 PM   Specimen: Foot; Body Fluid  Result Value Ref Range Status   Specimen Description   Final    FOOT LEFT Performed at [facility], [address], [place], [place] [address]    Special Requests   Final    NONE Performed at [facility], [address] [place], [place] [address]    Gram Stain   Final    MODERATE WBC PRESENT,BOTH PMN AND MONONUCLEAR RARE GRAM POSITIVE COCCI IN PAIRS    Culture   Final    RARE STAPHYLOCOCCUS AUREUS SUSCEPTIBILITIES TO FOLLOW Performed at [facility], [address], [place], [place] [address]    Report Status PENDING  Incomplete        Radiology Studies: No results found.      Scheduled Meds: . amiodarone  200 mg Oral BID  . colchicine  0.6 mg Oral Daily  . dexamethasone (DECADRON) injection  8 mg Intra-articular Once  . feeding supplement  237 mL Oral BID BM  . insulin aspart  0-9 Units Subcutaneous TID WC  . metoprolol tartrate  37.5 mg  Oral BID  . nutrition supplement (JUVEN)  1 packet Oral BID WC   Continuous Infusions: . lactated ringers 75 mL/hr at 07/20/20 0929  . vancomycin Stopped (07/19/20 2230)     LOS: 4 days   Carma Leaven DO 07/20/2020, 1:38 PM  If 7PM-7AM, please contact night-coverage [email or web address]

## 2020-07-20 NOTE — Progress Notes (Signed)
Patient CBG at 0754 was 65. Patient had no signs or symptoms of hypoglycemia . Gave patient apple juice and his juven nutritional supplement and informed patient grandson to have patient drink all of the juice and the juven if possible to increase CBG. Patient CBG rechecked at [phone] and is now 109. Will continue to monitor.

## 2020-07-20 NOTE — Progress Notes (Signed)
OT Cancellation Note  Patient Details Name: Brian Potter MRN: [id number] DOB: 06-Jul-1928   Cancelled Treatment:    Reason Eval/Treat Not Completed: Pain limiting ability to participate. Patient's daughter reports patient still having a lot of pain and has a stomachache. Education to patient's daughter and granddaughter about the negative effects of prolonged bedrest and the need for patient to get up everyday. They verbalized understanding.  Lekendrick Alpern L Celita Aron 07/20/2020, 4:13 PM

## 2020-07-20 NOTE — Care Management Important Message (Signed)
Important Message  Patient Details IM Letter given to the Patient. Name: Brian Potter MRN: [id number] Date of Birth: 08-01-1928   Medicare Important Message Given:  Yes     Caren Macadam 07/20/2020, 10:32 AM

## 2020-07-21 LAB — COMPREHENSIVE METABOLIC PANEL
ALT: 24 U/L (ref 0–44)
AST: 109 U/L — ABNORMAL HIGH (ref 15–41)
Albumin: 2 g/dL — ABNORMAL LOW (ref 3.5–5.0)
Alkaline Phosphatase: 82 U/L (ref 38–126)
Anion gap: 8 (ref 5–15)
BUN: 13 mg/dL (ref 8–23)
CO2: 25 mmol/L (ref 22–32)
Calcium: 7.4 mg/dL — ABNORMAL LOW (ref 8.9–10.3)
Chloride: 104 mmol/L (ref 98–111)
Creatinine, Ser: 0.77 mg/dL (ref 0.61–1.24)
GFR, Estimated: >60 mL/min (ref >60)
Glucose, Bld: 86 mg/dL (ref 70–99)
Potassium: 3.2 mmol/L — ABNORMAL LOW (ref 3.5–5.1)
Sodium: 137 mmol/L (ref 135–145)
Total Bilirubin: 0.5 mg/dL (ref 0.3–1.2)
Total Protein: 5.3 g/dL — ABNORMAL LOW (ref 6.5–8.1)

## 2020-07-21 LAB — CBC
HCT: 32.6 % — ABNORMAL LOW (ref 39.0–52.0)
Hemoglobin: 10.3 g/dL — ABNORMAL LOW (ref 13.0–17.0)
MCH: 27.7 pg (ref 26.0–34.0)
MCHC: 31.6 g/dL (ref 30.0–36.0)
MCV: 87.6 fL (ref 80.0–100.0)
Platelets: 238 10*3/uL (ref 150–400)
RBC: 3.72 MIL/uL — ABNORMAL LOW (ref 4.22–5.81)
RDW: 15.6 % — ABNORMAL HIGH (ref 11.5–15.5)
WBC: 8.5 10*3/uL (ref 4.0–10.5)
nRBC: 0 % (ref 0.0–0.2)

## 2020-07-21 LAB — CULTURE, BLOOD (ROUTINE X 2)
Culture: NO GROWTH
Special Requests: ADEQUATE

## 2020-07-21 LAB — GLUCOSE, CAPILLARY
Glucose-Capillary: 88 mg/dL (ref 70–99)
Glucose-Capillary: 100 mg/dL — ABNORMAL HIGH (ref 70–99)

## 2020-07-21 MED ORDER — AMIODARONE HCL 200 MG PO TABS: 200.0000 mg | ORAL_TABLET | Freq: Every day | ORAL | 0 refills | Status: AC

## 2020-07-21 MED ORDER — OXYCODONE-ACETAMINOPHEN 5-325 MG PO TABS: 1.0000 | ORAL_TABLET | ORAL | 0 refills | Status: AC | PRN

## 2020-07-21 MED ORDER — PREDNISONE 10 MG PO TABS: ORAL_TABLET | ORAL | 0 refills | Status: AC

## 2020-07-21 MED ORDER — METOPROLOL TARTRATE 37.5 MG PO TABS: 37.5000 mg | ORAL_TABLET | Freq: Two times a day (BID) | ORAL | 0 refills | Status: AC

## 2020-07-21 MED ORDER — CEPHALEXIN 500 MG PO CAPS: 500.0000 mg | ORAL_CAPSULE | Freq: Four times a day (QID) | ORAL | 0 refills | Status: AC

## 2020-07-21 NOTE — TOC Transition Note (Signed)
Transition of Care [facility]) - CM/SW Discharge Note   Patient Details  Name: Brian Potter MRN: [id number] Date of Birth: 1928-10-17  Transition of Care [facility]) CM/SW Contact:  Darleene Cleaver, LCSW Phone Number: 07/21/2020, 1:23 PM   Clinical Narrative:     Patient will be going home with home health through [facility].  CSW signing off please reconsult with any other social work needs, home health agency has been notified of planned discharge for today.   Final next level of care: Home w Home Health Services Barriers to Discharge: Barriers Resolved   Patient Goals and CMS Choice Patient states their goals for this hospitalization and ongoing recovery are:: To return back home with home health. CMS Medicare.gov Compare Post Acute Care list provided to:: Patient Represenative (must comment) Choice offered to / list presented to : Adult Children  Discharge Placement  Patient discharging back home with home health services.                     Discharge Plan and Services                          HH Arranged: RN,PT HH Agency: [facility] Date HH Agency Contacted: 07/21/20 Time HH Agency Contacted: 1323 Representative spoke with at [facility] Agency: Thea Silversmith  Social Determinants of Health (SDOH) Interventions     Readmission Risk Interventions Readmission Risk Prevention Plan 07/07/2020  Post Dischage Appt Complete  Medication Screening Complete  Transportation Screening Complete  Some recent data might be hidden

## 2020-07-21 NOTE — Plan of Care (Signed)

## 2020-07-21 NOTE — Discharge Summary (Signed)
Physician Discharge Summary  Brian Potter ZOX:[id number] DOB: [birth year] DOA: 07/15/2020  PCP: Patient, No Pcp Per  Admit date: 07/15/2020 Discharge date: 07/21/2020  Admitted From: Home Disposition: Home  Recommendations for Outpatient Follow-up:  1. Follow up with PCP in 1-2 weeks 2. Please obtain BMP/CBC in one week 3. Please follow up with orthopedics as scheduled  Home Health: Yes Equipment/Devices: PT OT  Discharge Condition: Stable CODE STATUS: DNR Diet recommendation: Gout diet as discussed  Brief/Interim Summary: Brian Potter is a 87 y.o. male with a history of gout, diabetes mellitus. Patient presented secondary to weakness and ankle pain with concern for acute gout. He was recently treated for ankle cellulitis. He also had a right elbow wound. Empiric antibiotics initiated. MRI of left ankle suggests severe gout flare. Arthrocentesis performed with evidence of urate crystals with possible evidence of bacterial infection.  Patient mated as above with ongoing weakness in the setting of ankle pain and notable right elbow pain with questionable recent wound with purulent discharge.  Empiric antibiotics initiated at admission, MRI more suggestive of gout given patient's history orthopedics was consulted for fluid removal of both the left ankle and right elbow, labs unremarkable for infection other than cultures growing rare gram-positive cocci with pansensitive MSSA.  Likely this is primarily a gout flare having improved with supportive care.  Unfortunately given patient's cultures we will continue antibiotics but have been able to transition to p.o. Keflex per discussion with ID/pharmacy to cover any possible underlying infection although less likely given presentation and labs.  We discussed at length with wife mother-in-law as well as daughter patient's need for dietary changes in the setting of gout flare, close follow-up with PCP in the next 1 to 2 weeks as well as with orthopedics as scheduled  for any further evaluation and treatment.  Patient otherwise stable and agreeable for discharge home.  Discharge Diagnoses:  Principal Problem:   Ankle wound, left, initial encounter Active Problems:   Chronic tophaceous gout of left foot   SIRS (systemic inflammatory response syndrome) (HCC)   Generalized weakness   Open wound of right elbow   Paroxysmal atrial fibrillation (HCC)   Tachycardia-bradycardia syndrome (HCC)   Essential hypertension    Discharge Instructions  Discharge Instructions    Call MD for:  persistant nausea and vomiting   Complete by: As directed    Call MD for:  redness, tenderness, or signs of infection (pain, swelling, redness, odor or green/yellow discharge around incision site)   Complete by: As directed    Call MD for:  severe uncontrolled pain   Complete by: As directed    Call MD for:  temperature >100.4   Complete by: As directed    Diet - low sodium heart healthy   Complete by: As directed    Discharge wound care:   Complete by: As directed    Cleanse wounds to left ankle and right posterior elbow with NS.  Apply Xeroform gauze to draining area.  Cover with dry gauze and kerlix/tape. Change daily.   Increase activity slowly   Complete by: As directed      Allergies as of 07/21/2020   No Known Allergies     Medication List    TAKE these medications   allopurinol 100 MG tablet Commonly known as: Zyloprim Take 1 tablet (100 mg total) by mouth daily.   amiodarone 200 MG tablet Commonly known as: PACERONE Take 1 tablet (200 mg total) by mouth daily.   cephALEXin 500 MG capsule  Commonly known as: KEFLEX Take 1 capsule (500 mg total) by mouth 4 (four) times daily for 7 days.   colchicine 0.6 MG tablet Take 1 tablet (0.6 mg total) by mouth 2 (two) times daily.   Metoprolol Tartrate 37.5 MG Tabs Take 37.5 mg by mouth 2 (two) times daily.   oxyCODONE-acetaminophen 5-325 MG tablet Commonly known as: PERCOCET/ROXICET Take 1-2 tablets  by mouth every 4 (four) hours as needed for moderate pain or severe pain.   pantoprazole 40 MG tablet Commonly known as: PROTONIX Take 1 tablet (40 mg total) by mouth 2 (two) times daily.   predniSONE 10 MG tablet Commonly known as: DELTASONE Take 6 tablets (60 mg total) by mouth daily with breakfast for 2 days, THEN 4 tablets (40 mg total) daily with breakfast for 2 days, THEN 2 tablets (20 mg total) daily with breakfast for 2 days, THEN 1 tablet (10 mg total) daily with breakfast for 2 days, THEN 0.5 tablets (5 mg total) daily with breakfast for 2 days. Start taking on: July 21, 2020 What changed: See the new instructions.   traMADol 50 MG tablet Commonly known as: ULTRAM Take 50 mg by mouth every 6 (six) hours as needed for moderate pain.            Discharge Care Instructions  (From admission, onward)         Start     Ordered   07/21/20 0000  Discharge wound care:       Comments: Cleanse wounds to left ankle and right posterior elbow with NS.  Apply Xeroform gauze to draining area.  Cover with dry gauze and kerlix/tape. Change daily.   07/21/20 1027          No Known Allergies  Consultations:  Orthopedic surgery, cardiology   Procedures/Studies: DG ELBOW COMPLETE RIGHT (3+VIEW)  Result Date: 07/04/2020 CLINICAL DATA:  Right elbow wound. EXAM: RIGHT ELBOW - COMPLETE 3+ VIEW COMPARISON:  None. FINDINGS: Chronic distracted fracture of the olecranon with deformity and hypertrophy of the coronoid process. Chronic radial head deformity with posterior dislocation. No acute fracture. No joint effusion. Posterior elbow soft tissue wound and few foci of subcutaneous emphysema. No definite bony destruction or periosteal reaction. IMPRESSION: 1. Posterior elbow soft tissue wound and few foci of subcutaneous emphysema. No radiographic evidence of osteomyelitis. 2. 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 Electronically Signed   By: Obie Dredge  M.D.   On: 07/04/2020 20:02   MR FOOT LEFT WO CONTRAST  Result Date: 07/04/2020 CLINICAL DATA:  Chronic left foot swelling, acutely worsened over the past week. EXAM: MRI OF THE LEFT FOOT WITHOUT CONTRAST TECHNIQUE: Multiplanar, multisequence MR imaging of the left forefoot was performed. No intravenous contrast was administered. COMPARISON:  Left foot x-rays from same day. FINDINGS: Bones/Joint/Cartilage There are multiple low T2 and T1 signal soft tissue masses with associated bony erosion centered on the first, third, and fourth TMT joints, first and fifth MTP joints, first IP joint, and second and third PIP joints. No fracture or dislocation. No joint effusion. Muscles and Tendons Flexor and extensor tendons are intact. Increased T2 signal within the intrinsic muscles of the forefoot, nonspecific, but likely related to diabetic muscle changes. Soft tissue Dorsal forefoot soft tissue swelling. No fluid collection or hematoma. IMPRESSION: 1. 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 No evidence of osteomyelitis. Electronically Signed   By: Obie Dredge M.D.   On: 07/04/2020 20:09   MR ANKLE LEFT WO  CONTRAST  Result Date: 07/16/2020 CLINICAL DATA:  Diabetic patient with pain, redness and swelling of the left ankle. History of gout. EXAM: MRI OF THE LEFT ANKLE WITHOUT CONTRAST TECHNIQUE: Multiplanar, multisequence MR imaging of the left ankle was performed. No intravenous contrast was administered. COMPARISON:  Plain films left foot 07/04/2020. FINDINGS: Bones/Joint/Cartilage Marrow edema and erosions are seen in both the medial and lateral malleoli, calcaneus and visualized bones of the midfoot. Edema is most extensive in the calcaneus and erosive change is worst about the subtalar joint. No fracture. 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 Otherwise negative. Muscles and Tendons There is a large volume of fluid in the sheaths of the peroneal  and posteromedial tendons with extensive synovial thickening and likely calcifications. There is also a large volume of fluid in the extensor digitorum tendons. No tear is identified. Soft tissues Soft tissue edema is present about the foot. There is a septated fluid collection along the lateral calcaneus measuring approximately 4 cm craniocaudal by 7 cm AP x 2 cm transverse. Calcifications in the soft tissues and fluid collections are better visualized on the prior plain films. IMPRESSION: Findings as described above are highly favored represent severe gouty arthropathy rather than osteomyelitis, septic tenosynovitis and abscess. The septated fluid collection along the lateral calcaneus is immediately be the skin surface and should be easily amenable to bedside aspiration which could assist in definitive diagnosis. Electronically Signed   By: Drusilla Kanner M.D.   On: 07/16/2020 09:19   DG Chest Portable 1 View  Result Date: 07/15/2020 CLINICAL DATA:  Sepsis.  Generalized weakness. EXAM: PORTABLE CHEST 1 VIEW COMPARISON:  None. FINDINGS: Low lung volumes. The heart is enlarged. Aortic atherosclerosis and tortuosity. No confluent consolidation. No large pleural effusion. No pneumothorax. No acute osseous abnormalities are seen. IMPRESSION: Low lung volumes with cardiomegaly. No evidence of congestive failure or acute pulmonary process. Electronically Signed   By: Narda Rutherford M.D.   On: 07/15/2020 19:21   DG Foot Complete Left  Result Date: 07/04/2020 CLINICAL DATA:  Left foot infection. EXAM: LEFT FOOT - COMPLETE 3+ VIEW COMPARISON:  None. FINDINGS: There is noted lytic destruction in multiple areas, including the distal portions of the first and fifth metatarsals as well as most of the fifth proximal phalanx. Rounded lucencies or lytic areas are also noted in the first proximal phalanx. There is soft tissue swelling around the first and fifth metatarsophalangeal joints these findings may simply  represent gout, but osteomyelitis cannot be excluded. Lytic lesion is also seen involving the distal portion of the third proximal phalanx. Dorsal soft tissue swelling is noted concerning for infection. Degenerative changes are seen involving the talocalcaneal joint. IMPRESSION: Lytic destruction is noted in multiple areas as described above. Soft tissue swelling is noted around the first and fifth metatarsophalangeal joints. Lytic lesion is also seen involving the distal portion of the third proximal phalanx. These findings may represent gout, but osteomyelitis must be considered. Further evaluation with MRI is recommended. Electronically Signed   By: Lupita Raider M.D.   On: 07/04/2020 11:18   ECHOCARDIOGRAM COMPLETE  Result Date: 07/17/2020    ECHOCARDIOGRAM REPORT   Patient Name:   JANIE Asche   Date of Exam: 07/17/2020 Medical Rec #:  [id number]  Height:       60.0 in Accession #:    [id number] Weight:       93.5 lb Date of Birth:  [birth year]  BSA:  1.351 m Patient Age:    87 years   BP:           106/49 mmHg Patient Gender: M          HR:           85 bpm. Exam Location:  Inpatient Procedure: 2D Echo Indications:    Atrial Flutter I48.92  History:        Patient has no prior history of Echocardiogram examinations.                 Risk Factors:Hypertension and Diabetes.  Sonographer:    Thurman Coyer RDCS (AE) Referring Phys: [phone] RALPH A NETTEY IMPRESSIONS  1. Left ventricular ejection fraction, by estimation, is 60 to 65%. The left ventricle has normal function. The left ventricle has no regional wall motion abnormalities. Left ventricular diastolic parameters are indeterminate due to atrial fibrillation.  2. Right ventricular systolic function is normal. The right ventricular size is normal. There is normal pulmonary artery systolic pressure.  3. The mitral valve is normal in structure. Trivial mitral valve regurgitation.  4. The aortic valve is tricuspid. There is mild calcification of the aortic  valve. There is mild thickening of the aortic valve. Aortic valve regurgitation is mild to moderate.  5. The inferior vena cava is normal in size with greater than 50% respiratory variability, suggesting right atrial pressure of 3 mmHg. Comparison(s): No prior Echocardiogram. FINDINGS  Left Ventricle: Left ventricular ejection fraction, by estimation, is 60 to 65%. The left ventricle has normal function. The left ventricle has no regional wall motion abnormalities. The left ventricular internal cavity size was normal in size. There is  no left ventricular hypertrophy. Left ventricular diastolic parameters are indeterminate. Right Ventricle: The right ventricular size is normal. No increase in right ventricular wall thickness. Right ventricular systolic function is normal. There is normal pulmonary artery systolic pressure. The tricuspid regurgitant velocity is 2.81 m/s, and  with an assumed right atrial pressure of 3 mmHg, the estimated right ventricular systolic pressure is 34.6 mmHg. Left Atrium: Left atrial size was normal in size. Right Atrium: Right atrial size was normal in size. Pericardium: There is no evidence of pericardial effusion. Mitral Valve: The mitral valve is normal in structure. There is mild thickening of the mitral valve leaflet(s). Trivial mitral valve regurgitation. Tricuspid Valve: The tricuspid valve is normal in structure. Tricuspid valve regurgitation is mild. Aortic Valve: The aortic valve is tricuspid. There is mild calcification of the aortic valve. There is mild thickening of the aortic valve. Aortic valve regurgitation is mild to moderate. Aortic regurgitation PHT measures 391 msec. Pulmonic Valve: The pulmonic valve was normal in structure. Pulmonic valve regurgitation is trivial. Aorta: The aortic root and ascending aorta are structurally normal, with no evidence of dilitation. Venous: The inferior vena cava is normal in size with greater than 50% respiratory variability, suggesting  right atrial pressure of 3 mmHg. IAS/Shunts: No atrial level shunt detected by color flow Doppler.  LEFT VENTRICLE PLAX 2D LVIDd:         4.60 cm LVIDs:         2.40 cm LV PW:         0.80 cm LV IVS:        0.90 cm LVOT diam:     2.10 cm LVOT Area:     3.46 cm  RIGHT VENTRICLE TAPSE (M-mode): 1.6 cm LEFT ATRIUM             Index  RIGHT ATRIUM          Index LA diam:        3.30 cm 2.44 cm/m  RA Area:     8.07 cm LA Vol (A2C):   32.2 ml 23.83 ml/m RA Volume:   12.60 ml 9.33 ml/m LA Vol (A4C):   31.9 ml 23.61 ml/m LA Biplane Vol: 33.2 ml 24.57 ml/m  AORTIC VALVE AI PHT:      391 msec  AORTA Ao Root diam: 3.10 cm TRICUSPID VALVE TR Peak grad:   31.6 mmHg TR Vmax:        281.00 cm/s  SHUNTS Systemic Diam: 2.10 cm Laurance Flatten MD Electronically signed by Laurance Flatten MD Signature Date/Time: 07/17/2020/12:32:14 PM    Final      Subjective: No acute issues or events overnight, via translator patient denies nausea vomiting diarrhea constipation headache fevers or chills.  Only complaint is ongoing pain in the left foot and right elbow but markedly improving from previous.   Discharge Exam: Vitals:   07/20/20 2037 07/21/20 0418  BP: (!) 146/59 130/62  Pulse: 77 72  Resp: 18 20  Temp: 98.9 F (37.2 C) 97.7 F (36.5 C)  SpO2: 93% 93%   Vitals:   07/20/20 1044 07/20/20 1747 07/20/20 2037 07/21/20 0418  BP: (!) 156/64 (!) 146/68 (!) 146/59 130/62  Pulse: 90 68 77 72  Resp:  16 18 20   Temp:  97.8 F (36.6 C) 98.9 F (37.2 C) 97.7 F (36.5 C)  TempSrc:  Oral Oral Oral  SpO2:  95% 93% 93%  Weight:      Height:       General exam: Appears calm and comfortable Respiratory system: Clear to auscultation. Respiratory effort normal. Cardiovascular system: S1 & S2 heard, RRR. No murmurs, rubs, gallops or clicks. Gastrointestinal system: Abdomen is nondistended, soft and nontender. No organomegaly or masses felt. Normal bowel sounds heard. Central nervous system:  Alert Musculoskeletal: No calf tenderness. Left ankle and right elbow dressings clean dry intact- decreased ROM secondary to pain. Skin: No cyanosis. No rashes    The results of significant diagnostics from this hospitalization (including imaging, microbiology, ancillary and laboratory) are listed below for reference.     Microbiology: Recent Results (from the past 240 hour(s))  SARS CORONAVIRUS 2 (TAT 6-24 HRS) Nasopharyngeal Nasopharyngeal Swab     Status: None   Collection Time: 07/15/20  5:25 PM   Specimen: Nasopharyngeal Swab  Result Value Ref Range Status   SARS Coronavirus 2 NEGATIVE NEGATIVE Final    Comment: (NOTE) SARS-CoV-2 target nucleic acids are NOT DETECTED.  The SARS-CoV-2 RNA is generally detectable in upper and lower respiratory specimens during the acute phase of infection. Negative results do not preclude SARS-CoV-2 infection, do not rule out co-infections with other pathogens, and should not be used as the sole basis for treatment or other patient management decisions. Negative results must be combined with clinical observations, patient history, and epidemiological information. The expected result is Negative.  Fact Sheet for Patients: 09/14/20  Fact Sheet for Healthcare Providers: HairSlick.no  This test is not yet approved or cleared by the [place] FDA and  has been authorized for detection and/or diagnosis of SARS-CoV-2 by FDA under an Emergency Use Authorization (EUA). This EUA will remain  in effect (meaning this test can be used) for the duration of the COVID-19 declaration under Se ction 564(b)(1) of the Act, 21 U.S.C. section 360bbb-3(b)(1), unless the authorization is terminated or revoked sooner.  Performed  at [facility], [address] [place], [place] [address]   Blood culture (routine x 2)     Status: None (Preliminary result)   Collection Time: 07/15/20  5:35  PM   Specimen: BLOOD LEFT ARM  Result Value Ref Range Status   Specimen Description   Final    BLOOD LEFT ARM Performed at [facility], [address] [place], [place] [address]    Special Requests   Final    BOTTLES DRAWN AEROBIC AND ANAEROBIC Blood Culture adequate volume Performed at [facility], [address] [place], [place] [address]    Culture   Final    NO GROWTH 4 DAYS Performed at [facility], [address] [place], [place] [address]    Report Status PENDING  Incomplete  MRSA PCR Screening     Status: None   Collection Time: 07/15/20  9:39 PM   Specimen: Nasal Mucosa; Nasopharyngeal  Result Value Ref Range Status   MRSA by PCR NEGATIVE NEGATIVE Final    Comment:        The GeneXpert MRSA Assay (FDA approved for NASAL specimens only), is one component of a comprehensive MRSA colonization surveillance program. It is not intended to diagnose MRSA infection nor to guide or monitor treatment for MRSA infections. Performed at [facility], [address] [place], [place] [address]   Aerobic Culture w Gram Stain (superficial specimen)     Status: None   Collection Time: 07/16/20  3:16 PM   Specimen: Foot; Body Fluid  Result Value Ref Range Status   Specimen Description   Final    FOOT LEFT Performed at [facility], [address] [place], [place] [address]    Special Requests   Final    NONE Performed at [facility], [address] [place], [place] [address]    Gram Stain   Final    MODERATE WBC PRESENT,BOTH PMN AND MONONUCLEAR RARE GRAM POSITIVE COCCI IN PAIRS Performed at [facility], [address] [place], [place] [address]    Culture RARE STAPHYLOCOCCUS AUREUS  Final   Report Status 07/20/2020 FINAL  Final   Organism ID, Bacteria STAPHYLOCOCCUS AUREUS  Final      Susceptibility   Staphylococcus aureus - MIC*    CIPROFLOXACIN <=0.5  SENSITIVE Sensitive     ERYTHROMYCIN <=0.25 SENSITIVE Sensitive     GENTAMICIN <=0.5 SENSITIVE Sensitive     OXACILLIN 0.5 SENSITIVE Sensitive     TETRACYCLINE <=1 SENSITIVE Sensitive     VANCOMYCIN <=0.5 SENSITIVE Sensitive     TRIMETH/SULFA <=10 SENSITIVE Sensitive     CLINDAMYCIN <=0.25 SENSITIVE Sensitive     RIFAMPIN <=0.5 SENSITIVE Sensitive     Inducible Clindamycin NEGATIVE Sensitive     * RARE STAPHYLOCOCCUS AUREUS     Labs: BNP (last 3 results) No results for input(s): BNP in the last 8760 hours. Basic Metabolic Panel: Recent Labs  Lab 07/16/20 0332 07/16/20 2356 07/17/20 0852 07/19/20 0336 07/20/20 0412 07/21/20 0354  NA 133*  --  135 138 136 137  K 3.4*  --  3.3* 3.6 3.3* 3.2*  CL 104  --  105 108 105 104  CO2 20*  --  19* 22 24 25   GLUCOSE 78  --  138* 104* 91 86  BUN 24*  --  25* 18 19 13   CREATININE 0.96  --  1.09 0.83 0.81 0.77  CALCIUM 7.4*  --  7.5* 7.4* 7.5* 7.4*  MG  --  1.8  --   --   --   --    Liver Function Tests: Recent Labs  Lab 07/15/20 1701 07/16/20 0332 07/20/20 0412 07/21/20 0354  AST 50* 37 142* 109*  ALT ALKPHOS 81 52 89 82  BILITOT 0.5 0.7 0.6 0.5  PROT 7.4 4.8* 5.5* 5.3*  ALBUMIN 2.8* 1.8* 2.3* 2.0*   No results for input(s): LIPASE, AMYLASE in the last 168 hours. No results for input(s): AMMONIA in the last 168 hours. CBC: Recent Labs  Lab 07/15/20 1701 07/16/20 0332 07/17/20 0852 07/19/20 0336 07/20/20 0412 07/21/20 0354  WBC 14.7* 12.0* 8.5 8.7 9.4 8.5  NEUTROABS 11.3*  --   --   --   --   --   HGB 12.9* 9.8* 10.0* 10.3* 11.0* 10.3*  HCT 40.5 31.5* 31.6* 32.5* 35.1* 32.6*  MCV 89.2 90.3 91.1 88.8 88.9 87.6  PLT 394 267 225 267 270 238   Cardiac Enzymes: No results for input(s): CKTOTAL, CKMB, CKMBINDEX, TROPONINI in the last 168 hours. BNP: Invalid input(s): POCBNP CBG: Recent Labs  Lab 07/20/20 1152 07/20/20 1647 07/20/20 2042 07/21/20 0810 07/21/20 1136  GLUCAP 87 84 88 88 100*    D-Dimer No results for input(s): DDIMER in the last 72 hours. Hgb A1c No results for input(s): HGBA1C in the last 72 hours. Lipid Profile No results for input(s): CHOL, HDL, LDLCALC, TRIG, CHOLHDL, LDLDIRECT in the last 72 hours. Thyroid function studies No results for input(s): TSH, T4TOTAL, T3FREE, THYROIDAB in the last 72 hours.  Invalid input(s): FREET3 Anemia work up No results for input(s): VITAMINB12, FOLATE, FERRITIN, TIBC, IRON, RETICCTPCT in the last 72 hours. Urinalysis No results found for: COLORURINE, APPEARANCEUR, LABSPEC, PHURINE, GLUCOSEU, HGBUR, BILIRUBINUR, KETONESUR, PROTEINUR, UROBILINOGEN, NITRITE, LEUKOCYTESUR Sepsis Labs Invalid input(s): PROCALCITONIN,  WBC,  LACTICIDVEN Microbiology Recent Results (from the past 240 hour(s))  SARS CORONAVIRUS 2 (TAT 6-24 HRS) Nasopharyngeal Nasopharyngeal Swab     Status: None   Collection Time: 07/15/20  5:25 PM   Specimen: Nasopharyngeal Swab  Result Value Ref Range Status   SARS Coronavirus 2 NEGATIVE NEGATIVE Final    Comment: (NOTE) SARS-CoV-2 target nucleic acids are NOT DETECTED.  The SARS-CoV-2 RNA is generally detectable in upper and lower respiratory specimens during the acute phase of infection. Negative results do not preclude SARS-CoV-2 infection, do not rule out co-infections with other pathogens, and should not be used as the sole basis for treatment or other patient management decisions. Negative results must be combined with clinical observations, patient history, and epidemiological information. The expected result is Negative.  Fact Sheet for Patients: HairSlick.no  Fact Sheet for Healthcare Providers: quierodirigir.com  This test is not yet approved or cleared by the [place] FDA and  has been authorized for detection and/or diagnosis of SARS-CoV-2 by FDA under an Emergency Use Authorization (EUA). This EUA will remain  in effect  (meaning this test can be used) for the duration of the COVID-19 declaration under Se ction 564(b)(1) of the Act, 21 U.S.C. section 360bbb-3(b)(1), unless the authorization is terminated or revoked sooner.  Performed at [facility], [address], [place], [place] [address]   Blood culture (routine x 2)     Status: None (Preliminary result)   Collection Time: 07/15/20  5:35 PM   Specimen: BLOOD LEFT ARM  Result Value Ref Range Status   Specimen Description   Final    BLOOD  LEFT ARM Performed at [facility], [address] [place] [place], [place] [address]    Special Requests   Final    BOTTLES DRAWN AEROBIC AND ANAEROBIC Blood Culture adequate volume Performed at [facility], [address] [place], [place] [address]    Culture   Final    NO GROWTH 4 DAYS Performed at [facility], [address], [place], [place] [address]    Report Status PENDING  Incomplete  MRSA PCR Screening     Status: None   Collection Time: 07/15/20  9:39 PM   Specimen: Nasal Mucosa; Nasopharyngeal  Result Value Ref Range Status   MRSA by PCR NEGATIVE NEGATIVE Final    Comment:        The GeneXpert MRSA Assay (FDA approved for NASAL specimens only), is one component of a comprehensive MRSA colonization surveillance program. It is not intended to diagnose MRSA infection nor to guide or monitor treatment for MRSA infections. Performed at [facility], [address], [place], [place] [address]   Aerobic Culture w Gram Stain (superficial specimen)     Status: None   Collection Time: 07/16/20  3:16 PM   Specimen: Foot; Body Fluid  Result Value Ref Range Status   Specimen Description   Final    FOOT LEFT Performed at [facility], [address], [place], [place] [address]    Special Requests   Final    NONE Performed at [facility], [address], [place], [place] [address]    Gram  Stain   Final    MODERATE WBC PRESENT,BOTH PMN AND MONONUCLEAR RARE GRAM POSITIVE COCCI IN PAIRS Performed at [facility], [address] [place], [place] [address]    Culture RARE STAPHYLOCOCCUS AUREUS  Final   Report Status 07/20/2020 FINAL  Final   Organism ID, Bacteria STAPHYLOCOCCUS AUREUS  Final      Susceptibility   Staphylococcus aureus - MIC*    CIPROFLOXACIN <=0.5 SENSITIVE Sensitive     ERYTHROMYCIN <=0.25 SENSITIVE Sensitive     GENTAMICIN <=0.5 SENSITIVE Sensitive     OXACILLIN 0.5 SENSITIVE Sensitive     TETRACYCLINE <=1 SENSITIVE Sensitive     VANCOMYCIN <=0.5 SENSITIVE Sensitive     TRIMETH/SULFA <=10 SENSITIVE Sensitive     CLINDAMYCIN <=0.25 SENSITIVE Sensitive     RIFAMPIN <=0.5 SENSITIVE Sensitive     Inducible Clindamycin NEGATIVE Sensitive     * RARE STAPHYLOCOCCUS AUREUS     Time coordinating discharge: Over 30 minutes  SIGNED:   Azucena Fallen, DO [facility] 07/21/2020, 1:52 PM Pager   If 7PM-7AM, please contact night-coverage [email or web address]

## 2020-07-21 NOTE — Progress Notes (Signed)
Patient noted to have loose stool. Small amount, three episodes.

## 2020-07-25 DIAGNOSIS — S51001A Unspecified open wound of right elbow, initial encounter: Secondary | ICD-10-CM | POA: Diagnosis not present

## 2020-07-25 DIAGNOSIS — M109 Gout, unspecified: Secondary | ICD-10-CM | POA: Diagnosis not present

## 2020-07-25 DIAGNOSIS — Z9114 Patient's other noncompliance with medication regimen: Secondary | ICD-10-CM | POA: Diagnosis not present

## 2020-07-25 DIAGNOSIS — M1A9XX1 Chronic gout, unspecified, with tophus (tophi): Secondary | ICD-10-CM | POA: Diagnosis not present

## 2020-07-25 DIAGNOSIS — Z789 Other specified health status: Secondary | ICD-10-CM | POA: Diagnosis not present

## 2020-07-26 ENCOUNTER — Encounter: Payer: Medicare Other | Admitting: Physician Assistant

## 2020-07-27 NOTE — Progress Notes (Signed)
This encounter was created in error - please disregard.

## 2020-08-23 ENCOUNTER — Ambulatory Visit: Payer: Medicare Other | Admitting: Internal Medicine

## 2020-09-10 DEATH — deceased

## 2020-09-21 ENCOUNTER — Ambulatory Visit: Payer: Medicare Other | Admitting: Internal Medicine

## 2022-03-05 IMAGING — MR MR FOOT*L* W/O CM
4 series · 40 of 40 positions shown · non-contrast
Comparison: Left foot x-rays from same day.

CLINICAL DATA: Chronic left foot swelling, acutely worsened over
the past week.

EXAM:
MRI OF THE LEFT FOOT WITHOUT CONTRAST
TECHNIQUE: Multiplanar, multisequence MR imaging of the left forefoot was
performed. No intravenous contrast was administered.

[Series 4: T1 · axial · left · 3.0mm · 0.51mm/px · z∈[-91,-22]mm · 8 of 22 slices shown (1 of 2)]
[im 1/22]
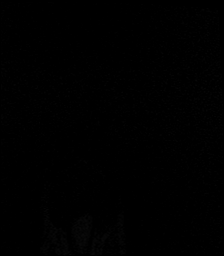
[im 4/22]
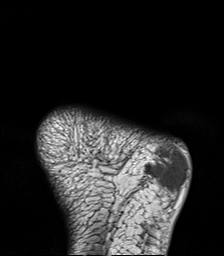
[im 7/22]
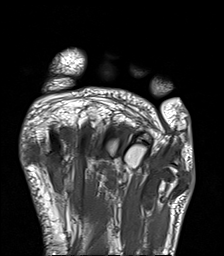
[im 10/22]
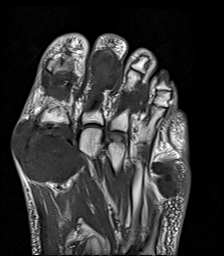
[im 13/22]
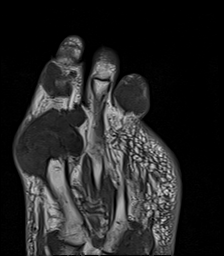
[im 16/22]
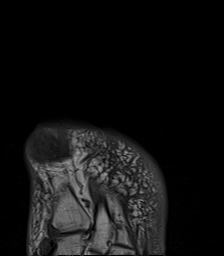
[im 19/22]
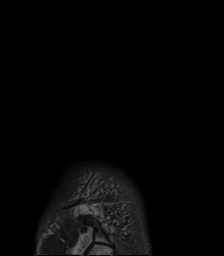
[im 22/22]
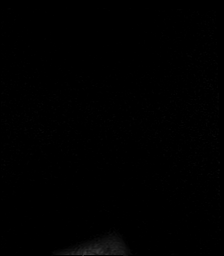

[Series 5: T2 fat-sat · axial · left · 3.0mm · 0.51mm/px · z∈[-91,-22]mm · 8 of 22 slices shown (1 of 2)]
[im 1/22]
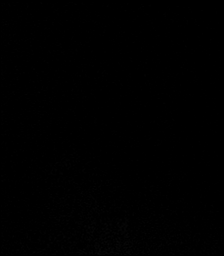
[im 4/22]
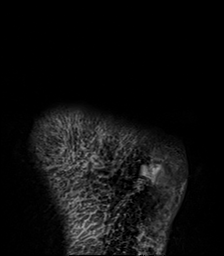
[im 7/22]
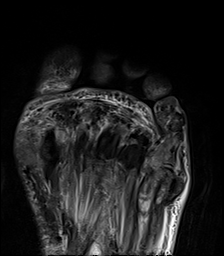
[im 10/22]
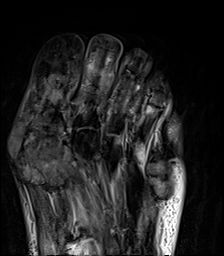
[im 13/22]
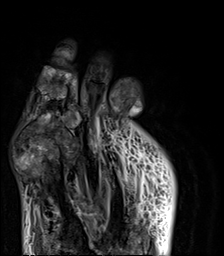
[im 16/22]
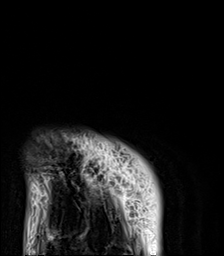
[im 19/22]
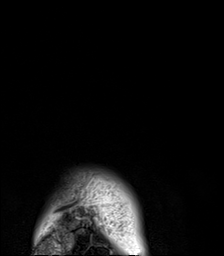
[im 22/22]
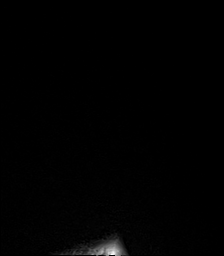

[Series 7: T1 · coronal · left · 3.0mm · 0.47mm/px · 12 of 31 slices shown (2 of 2)]
[im 1/31]
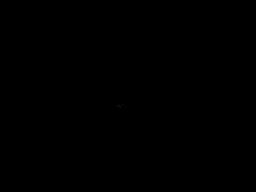
[im 3/31]
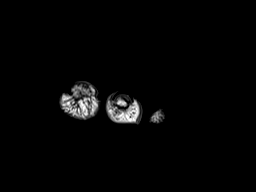
[im 6/31]
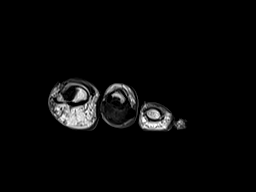
[im 9/31]
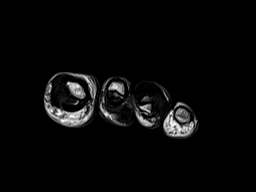
[im 11/31]
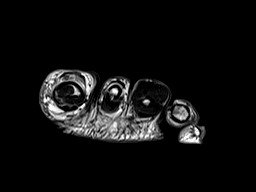
[im 14/31]
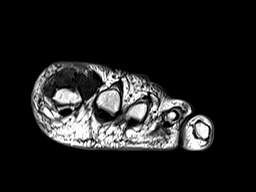
[im 17/31]
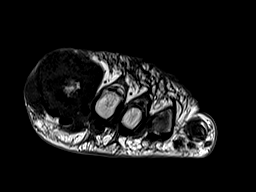
[im 20/31]
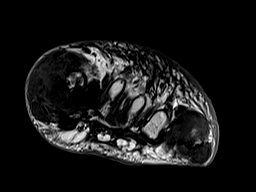
[im 22/31]
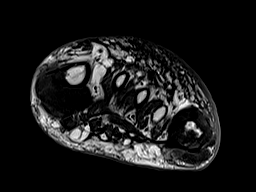
[im 25/31]
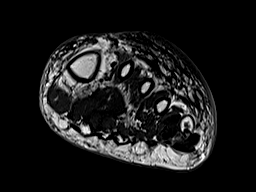
[im 28/31]
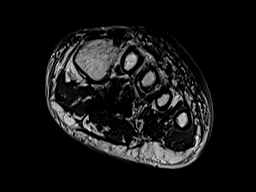
[im 31/31]
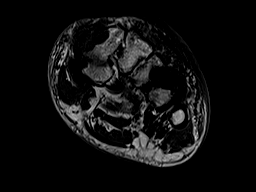

[Series 8: T2 fat-sat · coronal · left · 3.0mm · 0.38mm/px · 12 of 31 slices shown (2 of 2)]
[im 1/31]
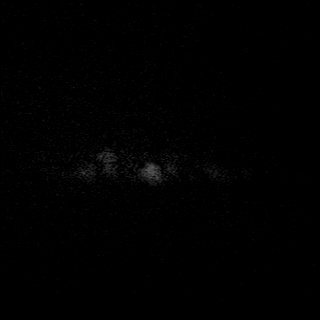
[im 3/31]
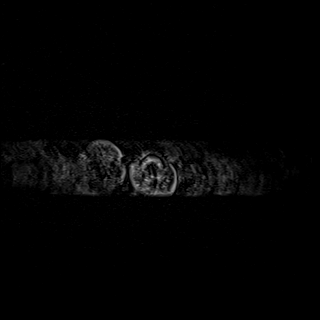
[im 6/31]
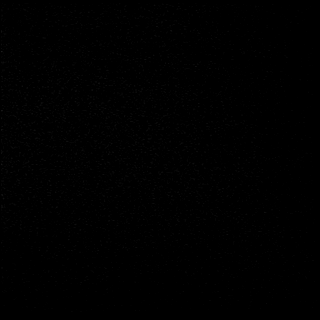
[im 9/31]
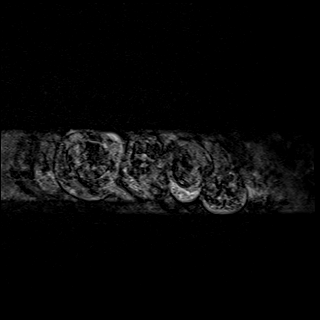
[im 11/31]
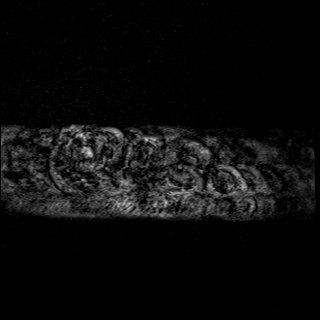
[im 14/31]
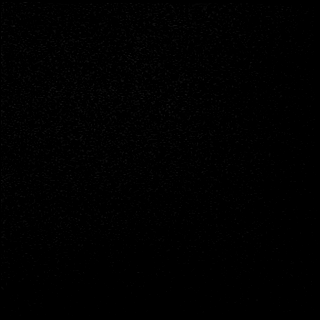
[im 17/31]
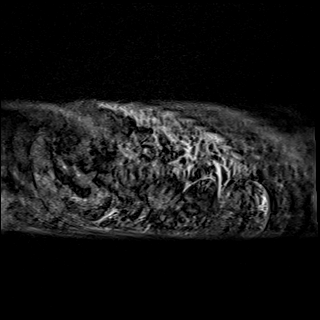
[im 20/31]
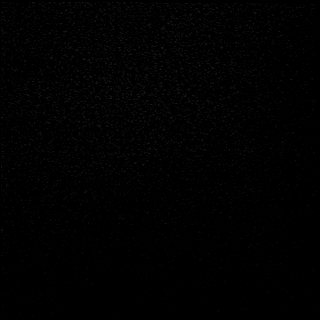
[im 22/31]
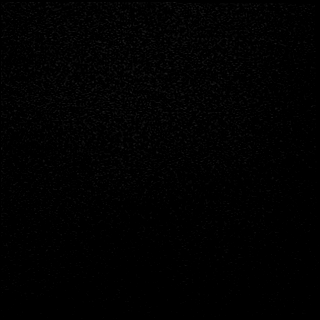
[im 25/31]
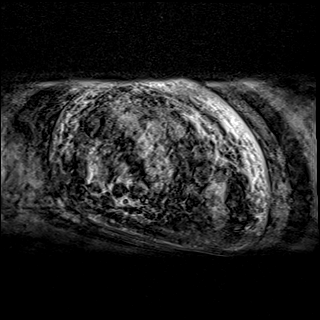
[im 28/31]
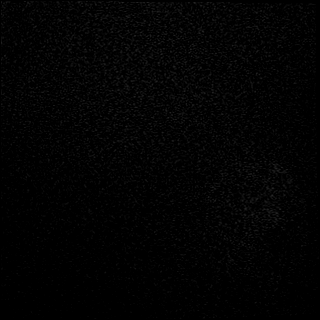
[im 31/31]
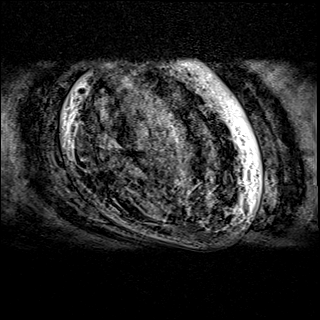

[40 of 40 positions shown; findings below may reference images not displayed]

FINDINGS: Bones/Joint/Cartilage

There are multiple low T2 and T1 signal soft tissue masses with
associated bony erosion centered on the first, third, and fourth TMT
joints, first and fifth MTP joints, first IP joint, and second and
third PIP joints. No fracture or dislocation. No joint effusion.

Muscles and Tendons
Flexor and extensor tendons are intact. Increased T2 signal within
the intrinsic muscles of the forefoot, nonspecific, but likely
related to diabetic muscle changes.

Soft tissue
Dorsal forefoot soft tissue swelling. No fluid collection or
hematoma.
IMPRESSION: 1. Extensive tophaceous gout involving the left foot. No evidence of
osteomyelitis.

## 2022-03-05 IMAGING — DX DG ELBOW COMPLETE 3+V*R*
3 series · 3 of 3 positions shown · non-contrast
Comparison: None.

CLINICAL DATA: Right elbow wound.

EXAM:
RIGHT ELBOW - COMPLETE 3+ VIEW

[elbow ap]
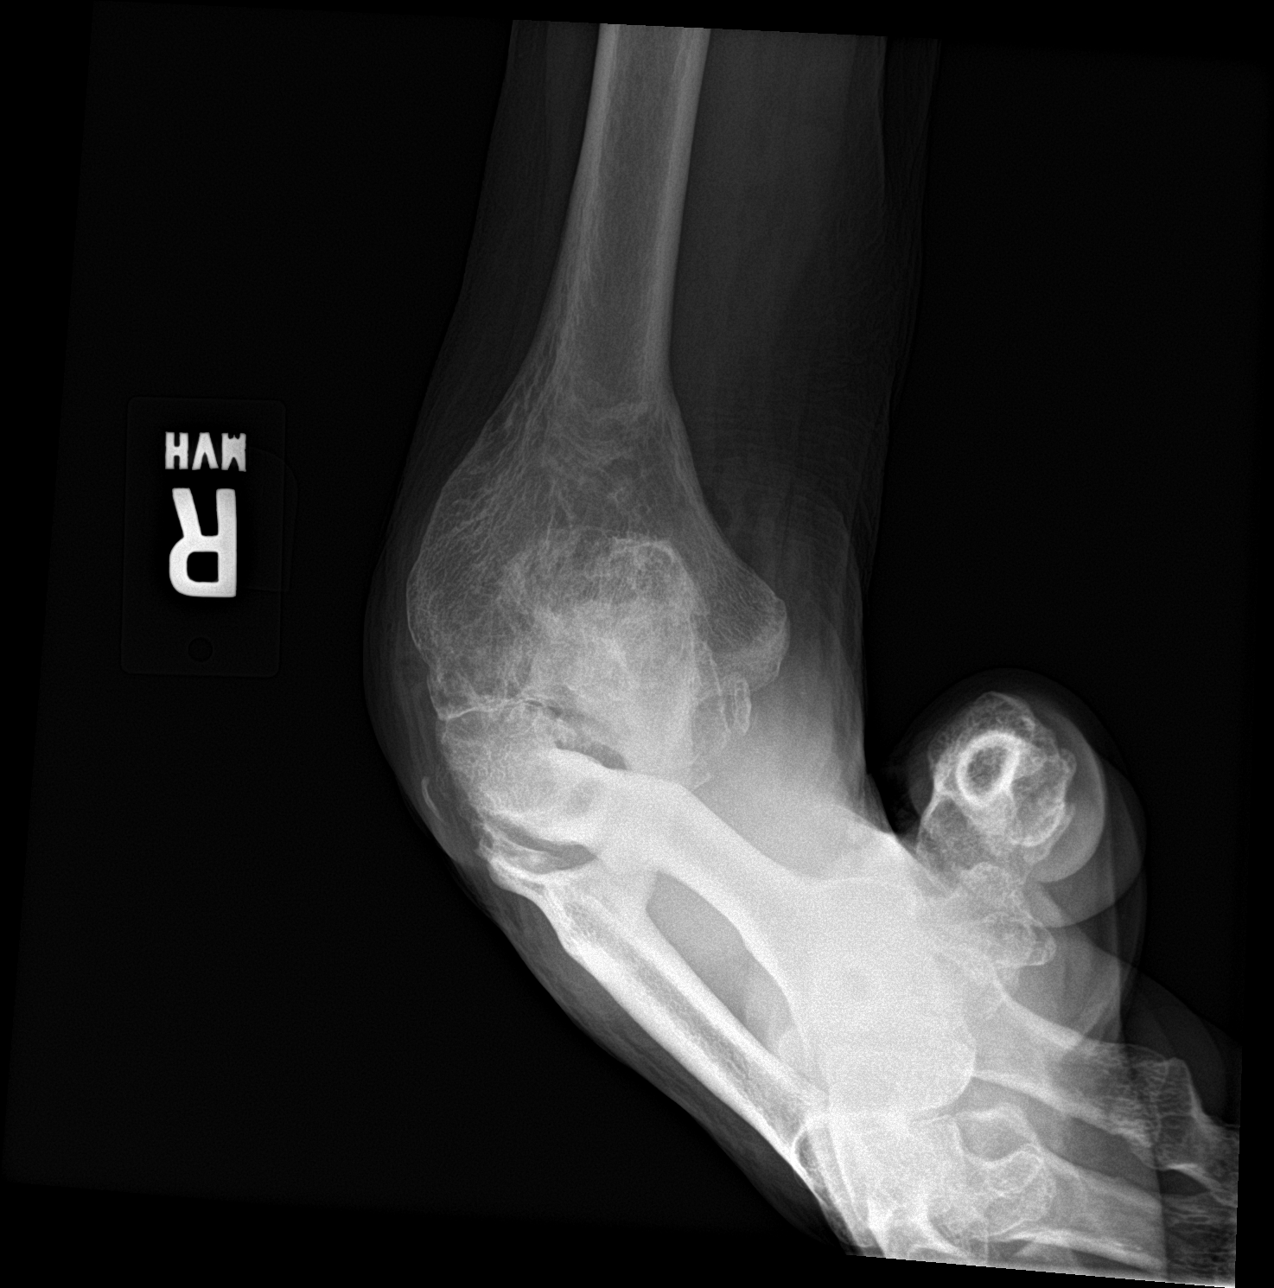

[elbow obl]
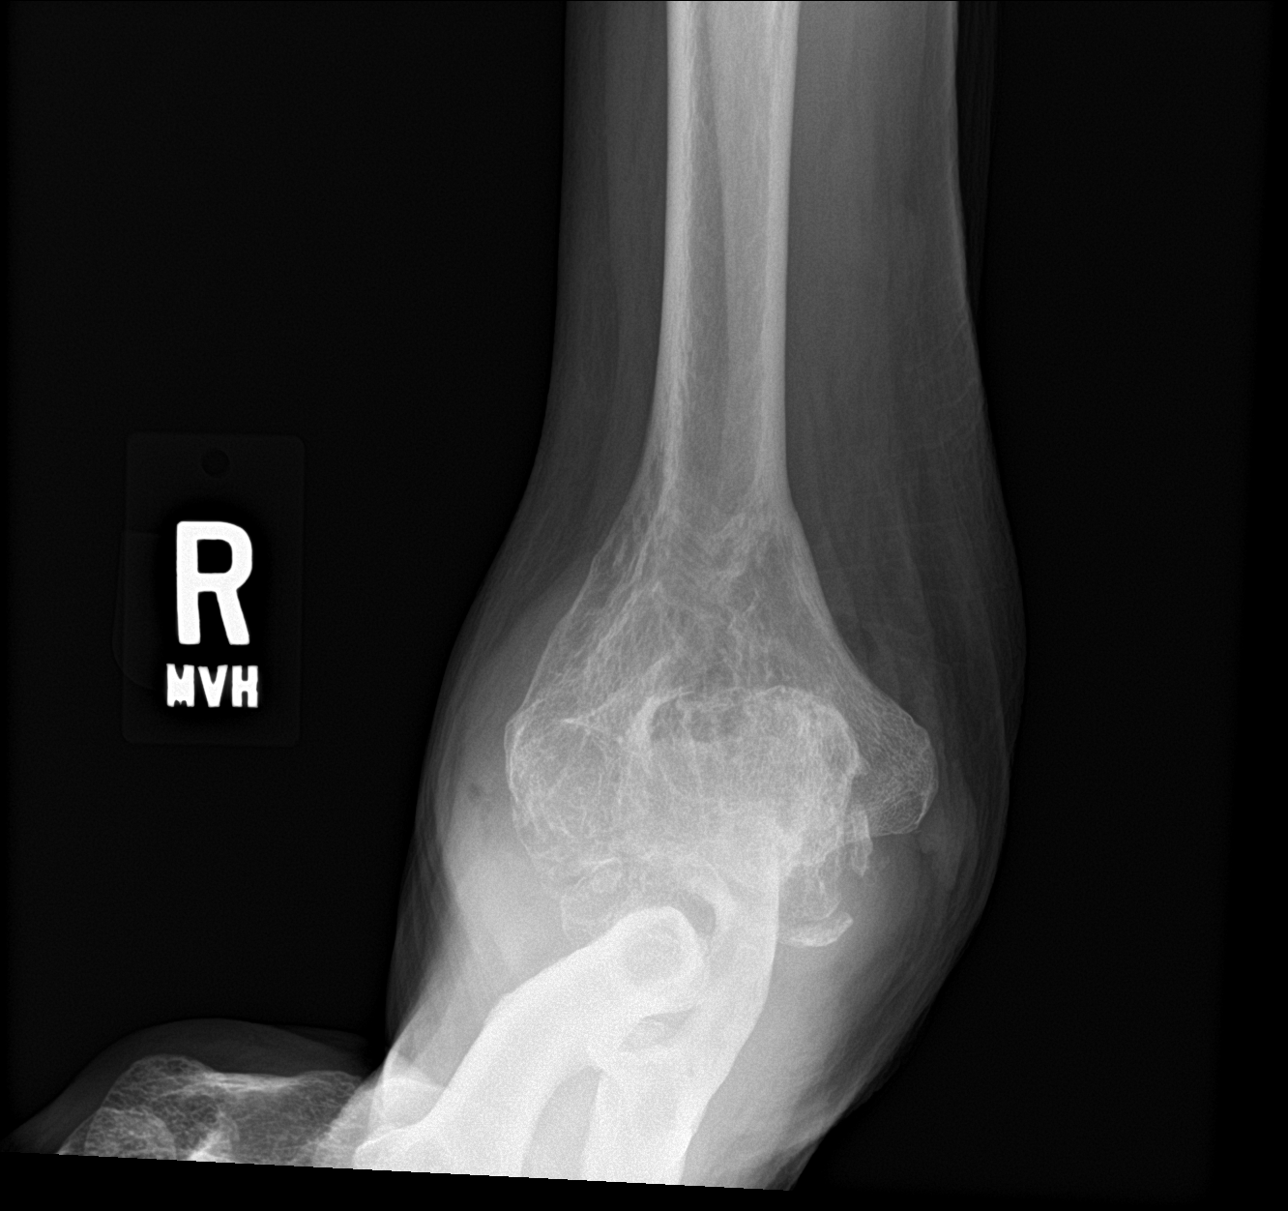

[elbow lat]
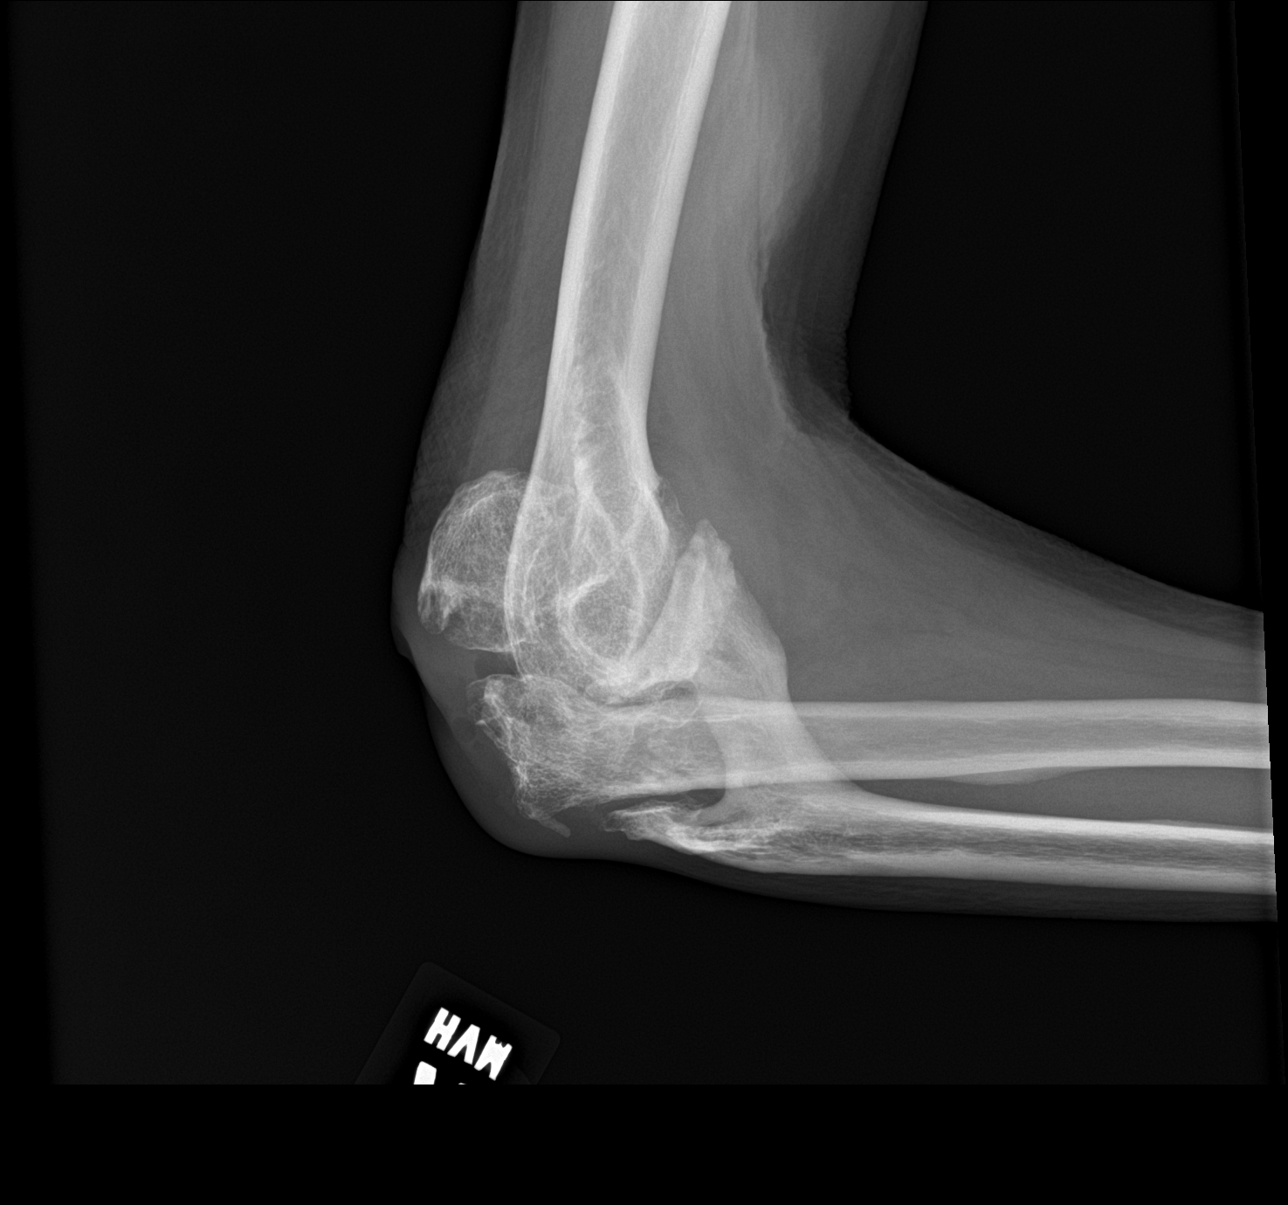

[3 of 3 positions shown; findings below may reference images not displayed]

FINDINGS: Chronic distracted fracture of the olecranon with deformity and
hypertrophy of the coronoid process. Chronic radial head deformity
with posterior dislocation.

No acute fracture. No joint effusion. Posterior elbow soft tissue
wound and few foci of subcutaneous emphysema. No definite bony
destruction or periosteal reaction.
IMPRESSION: 1. Posterior elbow soft tissue wound and few foci of subcutaneous
emphysema. No radiographic evidence of osteomyelitis.
2. Chronic fracture dislocation deformity of the elbow as described
above.
